# Patient Record
Sex: Female | Born: 1940
Health system: Southern US, Community
[De-identification: ages and names within clinical notes are randomized; demographics above are authoritative.]

## PROBLEM LIST (undated history)

## (undated) DIAGNOSIS — M810 Age-related osteoporosis without current pathological fracture: Secondary | ICD-10-CM

## (undated) DIAGNOSIS — K900 Celiac disease: Secondary | ICD-10-CM

## (undated) DIAGNOSIS — J45909 Unspecified asthma, uncomplicated: Secondary | ICD-10-CM

## (undated) DIAGNOSIS — E079 Disorder of thyroid, unspecified: Secondary | ICD-10-CM

## (undated) HISTORY — PX: SINUS EXPLORATION: SHX5214

## (undated) HISTORY — PX: CHOLECYSTECTOMY: SHX55

## (undated) HISTORY — PX: ABDOMINAL HYSTERECTOMY: SHX81

---

## 2000-06-21 ENCOUNTER — Other Ambulatory Visit: Admission: RE | Admit: 2000-06-21 | Discharge: 2000-06-21 | Payer: Self-pay | Admitting: Cardiology

## 2003-11-12 ENCOUNTER — Ambulatory Visit (HOSPITAL_COMMUNITY): Admission: RE | Admit: 2003-11-12 | Discharge: 2003-11-12 | Payer: Self-pay | Admitting: Gastroenterology

## 2003-11-17 ENCOUNTER — Inpatient Hospital Stay (HOSPITAL_COMMUNITY): Admission: EM | Admit: 2003-11-17 | Discharge: 2003-11-19 | Payer: Self-pay | Admitting: Emergency Medicine

## 2003-11-19 ENCOUNTER — Encounter (INDEPENDENT_AMBULATORY_CARE_PROVIDER_SITE_OTHER): Payer: Self-pay | Admitting: Specialist

## 2003-12-02 ENCOUNTER — Ambulatory Visit (HOSPITAL_COMMUNITY): Admission: RE | Admit: 2003-12-02 | Discharge: 2003-12-02 | Payer: Self-pay | Admitting: Gastroenterology

## 2003-12-02 ENCOUNTER — Encounter (INDEPENDENT_AMBULATORY_CARE_PROVIDER_SITE_OTHER): Payer: Self-pay | Admitting: Specialist

## 2003-12-03 ENCOUNTER — Inpatient Hospital Stay (HOSPITAL_COMMUNITY): Admission: AD | Admit: 2003-12-03 | Discharge: 2003-12-06 | Payer: Self-pay | Admitting: Internal Medicine

## 2003-12-03 ENCOUNTER — Encounter (INDEPENDENT_AMBULATORY_CARE_PROVIDER_SITE_OTHER): Payer: Self-pay | Admitting: Internal Medicine

## 2004-02-27 ENCOUNTER — Ambulatory Visit (HOSPITAL_COMMUNITY): Admission: RE | Admit: 2004-02-27 | Discharge: 2004-02-27 | Payer: Self-pay | Admitting: Gastroenterology

## 2007-12-03 ENCOUNTER — Emergency Department (HOSPITAL_COMMUNITY): Admission: EM | Admit: 2007-12-03 | Discharge: 2007-12-03 | Payer: Self-pay | Admitting: Emergency Medicine

## 2010-04-16 ENCOUNTER — Emergency Department (HOSPITAL_COMMUNITY): Admission: EM | Admit: 2010-04-16 | Discharge: 2010-04-16 | Payer: Self-pay | Admitting: Emergency Medicine

## 2011-03-15 LAB — COMPREHENSIVE METABOLIC PANEL
ALT: 23 U/L (ref 0–35)
AST: 28 U/L (ref 0–37)
Albumin: 4.1 g/dL (ref 3.5–5.2)
Alkaline Phosphatase: 75 U/L (ref 39–117)
BUN: 18 mg/dL (ref 6–23)
CO2: 27 mEq/L (ref 19–32)
Calcium: 8.9 mg/dL (ref 8.4–10.5)
Chloride: 107 mEq/L (ref 96–112)
Creatinine, Ser: 1.23 mg/dL — ABNORMAL HIGH (ref 0.4–1.2)
GFR calc Af Amer: 53 mL/min — ABNORMAL LOW (ref >60)
GFR calc non Af Amer: 44 mL/min — ABNORMAL LOW (ref >60)
Glucose, Bld: 114 mg/dL — ABNORMAL HIGH (ref 70–99)
Potassium: 3.7 mEq/L (ref 3.5–5.1)
Sodium: 141 mEq/L (ref 135–145)
Total Bilirubin: 0.7 mg/dL (ref 0.3–1.2)
Total Protein: 7.4 g/dL (ref 6.0–8.3)

## 2011-03-15 LAB — DIFFERENTIAL
Basophils Absolute: 0.1 10*3/uL (ref 0.0–0.1)
Basophils Relative: 0 % (ref 0–1)
Eosinophils Absolute: 0 10*3/uL (ref 0.0–0.7)
Eosinophils Relative: 0 % (ref 0–5)
Lymphocytes Relative: 10 % — ABNORMAL LOW (ref 12–46)
Lymphs Abs: 1.4 10*3/uL (ref 0.7–4.0)
Monocytes Absolute: 0.9 10*3/uL (ref 0.1–1.0)
Monocytes Relative: 6 % (ref 3–12)
Neutro Abs: 12 10*3/uL — ABNORMAL HIGH (ref 1.7–7.7)
Neutrophils Relative %: 83 % — ABNORMAL HIGH (ref 43–77)

## 2011-03-15 LAB — URINALYSIS, ROUTINE W REFLEX MICROSCOPIC
Bilirubin Urine: NEGATIVE
Glucose, UA: NEGATIVE mg/dL
Hgb urine dipstick: NEGATIVE
Ketones, ur: NEGATIVE mg/dL
Nitrite: NEGATIVE
Protein, ur: NEGATIVE mg/dL
Specific Gravity, Urine: 1.017 (ref 1.005–1.030)
Urobilinogen, UA: 0.2 mg/dL (ref 0.0–1.0)
pH: 6.5 (ref 5.0–8.0)

## 2011-03-15 LAB — CBC
HCT: 46.4 % — ABNORMAL HIGH (ref 36.0–46.0)
Hemoglobin: 14.9 g/dL (ref 12.0–15.0)
MCHC: 32.1 g/dL (ref 30.0–36.0)
MCV: 88 fL (ref 78.0–100.0)
Platelets: 192 10*3/uL (ref 150–400)
RBC: 5.28 MIL/uL — ABNORMAL HIGH (ref 3.87–5.11)
RDW: 15.3 % (ref 11.5–15.5)
WBC: 14.4 10*3/uL — ABNORMAL HIGH (ref 4.0–10.5)

## 2011-03-15 LAB — URINE CULTURE
Colony Count: NO GROWTH
Culture: NO GROWTH

## 2011-03-15 LAB — URINE MICROSCOPIC-ADD ON

## 2011-05-13 NOTE — Consult Note (Signed)
NAME:  Jessica Esparza, Jessica Esparza                       ACCOUNT NO.:  [id number]   MEDICAL RECORD NO.:  [id number]                   PATIENT TYPE:  AMB   LOCATION:  ENDO                                 FACILITY:  [facility]   PHYSICIAN:  Lorenza Burton, M.D.                DATE OF BIRTH:  [birth year]   DATE OF CONSULTATION:  11/12/2003  DATE OF DISCHARGE:  11/12/2003                                   CONSULTATION   REASON FOR CONSULTATION:  Diarrhea times two weeks, nausea and vomiting.   ASSESSMENT:  1. Diarrhea times two weeks, rule out Clostridium difficile colitis,     Giardia, Cryptococcus, collagenous colitis.  2. Nausea and vomiting, etiology unknown; question viral syndrome.  3. Hypertension since 1998.  4. Hypothyroidism since 1998.  5. Gastroesophageal reflux disease on proton pump inhibitor.  6. Hemorrhoids, internal.  7. Hyperlipidemia.  8. Question of chronic renal insufficiency, check BUN and creatine today.  9. Osteoporosis diagnosed in December of 2002.  10.      History of rosacea.  11.      Hysterectomy at age 76.  12.      Status post cholecystectomy in 1992.  13.      Sinus surgery in 1996.  14.      Foot fracture summer of this year.  15.      Decreased potassium today 2.8.   RECOMMENDATIONS:  1. Hydrate, replace potassium.  2. Flexible sigmoidoscopy today.  3. Colonoscopy if flexible sigmoidoscopy is unrevealing.  4 . Stool studies for C. differential, Giardia and Cryptosporidia.  1. Continue Phenergan p.r.n.   HISTORY OF PRESENT ILLNESS:  As mentioned above, Jessica Esparza is a 70-year-  old female who is a patient of Dr. Rodrigo Ran who presents today with a two  week history of diarrhea and nausea and vomiting with most all solid foods  with unclear etiology, here for further evaluation.   ALLERGIES:  IBUPROFEN AND CODEINE.  IBUPROFEN CAUSES CHEST TIGHTNESS.  CODEINE CAUSES NAUSEA.  PREDNISONE CAUSES HOT FLASHES.   MEDICATIONS:  1. Synthroid 137 mcg  p.o. daily.  2. Prilosec OTC once daily.  3. Premarin 0.625 daily.  4. Clarinex 5 mg.  5. Flonase 50 mcg.  6. Fosamax 70 mg once per week.  7. Calcium 500 three times per week.  8. Xanax 0.5 q.h.s. and p.r.n.  9. Zantac 75 daily.   SOCIAL HISTORY:  She is married.  She lives with her husband here in  [place].  No history of alcohol or drug use.  Previous tobacco use for 15  years, quit in 1980.  Has three grown children.   FAMILY HISTORY:  Mother died of lung cancer at age 21, had hypertension.  Father is in his 5s, in a nursing home.  Has coronary artery disease,  pacemaker, and five siblings with no significant medical problems.   REVIEW OF SYSTEMS:  Diarrhea x2 weeks, nausea and  vomiting x2 weeks.  Question URI early October, 2004, given a prescription for Zithromax, now is  improved.   PHYSICAL EXAMINATION:  A 70 year old white female with above medical  problems.  Was treated with Trimox for one week.  She was doing well until  two weeks ago when she developed nausea, vomiting with diarrhea.  She was  seen by Dr. Rodrigo Ran on Monday, November 10, 2003, and was prescribed  metronidazole which she was unable to keep down because of her nausea and  vomiting.  She is averaging six to eight loose bowel movements per day  without any evidence of hematochezia and melena.  She has been using  Kaopectate and therefore has seen some black stools.  She denies any  abdominal pain or fever.  There is no chills or rigors.  Patient denies any  hematemesis or CGE.  She has no cardiorespiratory or GU complaints at  present.  Her weight has been stable.  She denies any NSAID use.  There is  no history of ulcers, jaundice or colitis.   LABORATORY DATA:  White count was 6.4, hemoglobin of 15.5, hematocrit 46.7,  platelets 273.  Sodium 138, potassium 2.8, chloride 110, bicarb. 21, BUN 10,  creatinine of 1.7, glucose of 97.  Giardia and Crypto were negative, C.  differential was negative.   LFTs were all normal.  Calcium was 7.8.   Will follow up with the patient closely to see if any of her symptoms  resolve.     Catalina Pizza, M.D.                           Lorenza Burton, M.D.        ZH/MEDQ  D:  11/17/2003  T:  11/17/2003  Job:  [id number]   cc:   Loraine Leriche A. Waynard Edwards, M.D.  [address]  [place]  [place] [address]  Fax: [phone]

## 2011-05-13 NOTE — Op Note (Signed)
NAME:  Jessica Esparza, Jessica Esparza                       ACCOUNT NO.:  [id number]   MEDICAL RECORD NO.:  [id number]                   PATIENT TYPE:  INP   LOCATION:  5157                                 FACILITY:  [facility]   PHYSICIAN:  Anselmo Rod, M.D.               DATE OF BIRTH:  January 05, 1941   DATE OF PROCEDURE:  11/19/2003  DATE OF DISCHARGE:  11/19/2003                                 OPERATIVE REPORT   PROCEDURE PERFORMED:  Esophagogastroduodenoscopy with biopsies.   ENDOSCOPIST:  Anselmo Rod, M.D.   INSTRUMENT USED:  Olympus video panendoscope.   INDICATION FOR PROCEDURE:  A 70 year old white female with a history of  nausea, vomiting, and diarrhea undergoing EGD to rule out peptic ulcer  disease and to bowel sounds the small bowel to further evaluate her  diarrhea.   PREPROCEDURE PREPARATION:  Informed consent was procured from the patient.  The patient was fasted for eight hours prior to the procedure.   PREPROCEDURE PHYSICAL:  VITAL SIGNS:  The patient had stable vital signs.  NECK:  Supple.  CHEST:  Clear to auscultation.  S1, S2 regular.  ABDOMEN:  Soft with normal bowel sounds.   DESCRIPTION OF PROCEDURE:  The patient was placed in the left lateral  decubitus position and sedated with 100 mg of Demerol and 10 mg of Versed in  slow incremental doses.  Once the patient was adequately sedate and  maintained on low-flow oxygen and continuous cardiac monitoring, the Olympus  video panendoscope was advanced through the mouthpiece, over the tongue,  into the esophagus under direct vision.  The entire esophagus appeared  normal with no evidence of ring, stricture, masses, esophagitis, or  Barrett's mucosa.  On advancing the scope into the stomach, the entire  gastric mucosa appeared healthy and without lesions.  There was some barium  present in the small bowel.  Biopsies were done to evaluate the patient's  diarrhea.  No erosions, ulcerations, masses, polyps, etc., were seen.   The  patient tolerated the procedure well without complications.  Retroflexion in  the high cardia revealed no abnormalities.   IMPRESSION:  Essentially normal EGD except for some barium present in the  small bowel, biopsies done from the small bowel to evaluate the patient's  diarrhea.  No ulcers, erosions, masses, or polyps seen.   RECOMMENDATIONS:  1. Await pathology results.  2. Avoid nonsteroidals.  3. Proceed with a colonoscopy at this time.  Further recommendations will be     made after colonoscopy has been done.                                              Anselmo Rod, M.D.   JNM/MEDQ  D:  11/20/2003  T:  11/20/2003  Job:  [id number]

## 2011-05-13 NOTE — Op Note (Signed)
NAME:  [name], Jessica Esparza                       ACCOUNT NO.:  [id number]   MEDICAL RECORD NO.:  [id number]                   PATIENT TYPE:  AMB   LOCATION:  ENDO                                 FACILITY:  [facility]   PHYSICIAN:  Anselmo Rod, M.D.               DATE OF BIRTH:  1941-07-30   DATE OF PROCEDURE:  12/02/2003  DATE OF DISCHARGE:                                 OPERATIVE REPORT   PROCEDURE PERFORMED:  Colonoscopy with biopsies.   ENDOSCOPIST:  Anselmo Rod, M.D.   INSTRUMENT USED:  Olympus videocolonoscope.   INDICATIONS FOR PROCEDURE:  This is a 70 year old white female with a  history of change in bowel habits, severe diarrhea, and hypokalemia,  undergoing a repeat colonoscopy as a colonoscopy done prior to Thanksgiving  showed large amount of residual barium in the colon, and visualization was  incomplete.   PREPROCEDURE PREPARATION:  Informed consent was secured from the patient.  The patient fasted for eight hours prior to the procedure and prepped with a  bottle of magnesium citrate and one gallon of GoLYTELY the night prior to  the procedure.   PREPROCEDURE PHYSICAL:  VITAL SIGNS:  Stable.  NECK:  Supple.  CHEST:  Clear to auscultation.  HEART:  S1, S2 regular.  ABDOMEN:  Soft, with normal bowel sounds.   DESCRIPTION OF THE PROCEDURE:  The patient was placed in the left lateral  decubitus position and sedated with 80 mg of Demerol and 8 mg of Versed  intravenously.  Once the patient was adequately sedated and maintained on  low-flow oxygen and continuous cardiac monitor, the Olympus videocolonoscope  was advanced from the rectum to the cecum.  There was some residual stool in  the colon.  Multiple washings were done.  The patient's position was changed  from the left lateral to the supine and the right lateral position to  visualize the underlying mucosa and move the stool __________.  The  appendiceal orifice and ileocecal valve were clearly visualized.   In spite  of several efforts, the terminal ileum could not be intubated.  There was  some loss of vascular markings in the cecum and right colon.  Random  biopsies were done to rule out collagenous versus microscopic colitis.  A  small sessile polyp was biopsied at 30 cm.  Retroflexion of the rectum  revealed small internal hemorrhoids.  Small external hemorrhoids were seen  on anal inspection.  The patient tolerated the procedure well without  complications.   IMPRESSION:  1. Small nonbleeding internal and external hemorrhoids.  2. Small sessile polyp biopsied at 30 cm.  3. Loss of vascular markings of the right colon and cecum.  Biopsies done,     results pending.   RECOMMENDATIONS:  1. Avoid nonsteroidals.  2. Await pathology results.  3. Continue potassium supplementation.  The patient is going to receive four     runs of potassium today.  Her BMET     revealed a potassium of 2.5.  Outpatient follow-up in the next three to     four weeks.  4. Follow up with Dr. Waynard Edwards in for further recommendations with regards to     electrolyte problems and diarrhea in the near future.                                               Anselmo Rod, M.D.    JNM/MEDQ  D:  12/02/2003  T:  12/03/2003  Job:  [id number]   cc:   Loraine Leriche A. Waynard Edwards, M.D.  [address]  [place]  [place] [address]  Fax: [phone]

## 2011-05-13 NOTE — H&P (Signed)
NAME:  Jessica Esparza, Jessica Esparza                       ACCOUNT NO.:  [id number]   MEDICAL RECORD NO.:  [id number]                   PATIENT TYPE:  INP   LOCATION:  1845                                 FACILITY:  [facility]   PHYSICIAN:  Anselmo Rod, M.D.               DATE OF BIRTH:  [birth year]   DATE OF ADMISSION:  11/17/2003  DATE OF DISCHARGE:                                HISTORY & PHYSICAL   REASON FOR ADMISSION:  Continued nausea and vomiting, dehydration, and  hypokalemia.   HISTORY OF PRESENT ILLNESS:  Jessica Esparza is a 70 year old white female with  a past medical history significant for list below who was treated  approximately one month ago with a Zithromax Tri Pack for suspected upper  respiratory infection.  She was also started on Advair and albuterol at that  time secondary to wheezing associated with ibuprofen use for pain.  Shortly  after starting the Zithromax, the patient had an episode of diarrhea and  this has continued off and on with frequent urgency with her diarrhea watery  in nature, no significant color, no melena or hematochezia.  She continued  to have the diarrhea, and then approximately two weeks ago developed nausea  and vomiting with diarrhea as well.  She had recently seen Dr. Rodrigo Ran  on November 10, 2003, and was prescribed metronidazole.  This was stopped  when the patient was seen on November 12, 2003, by Dr. Charna Elizabeth for a  flexible sigmoidoscopy which revealed a normal colon.  She reports having  nausea and vomiting over this past weekend with half a banana and some  yogurt.  She has not had any significant solid food in over 1-1/2 weeks.  She states she has been drinking lots of fluid, but has had six to eight  loose bowel movements per day, some which wake her up at night.  Has tried  Kaopectate, but was stopped and continued taking Lomotil.  She denies any  abdominal pain, no fever, chills, rigors, no hematemesis, no significant  reflux, no  problems with chest pain or respiratory complaints, no GU  complaints.  Denies any weight loss or weight gain.  Denies any history of  ulcers, jaundice, or colitis.   PAST MEDICAL HISTORY:  1. Hypertension since 1998.  2. Hypothyroidism since 1998.  3. GERD, on PPI.  4. Hemorrhoids, internal.  5. Hyperlipidemia.  6. Question of chronic renal insufficiency.  7. Osteoporosis.  8. History of rosacea.  9. Hysterectomy at age 26.  1.      Status post cholecystectomy in 1992.  11.      Sinus surgery in 1996.  12.      Foot fracture in the summer of 2004.  13.      Hypercalcemia, question of sarcoidosis, no biopsies have been done,     no source of sarcoid localized.   MEDICATIONS ON ADMISSION:  1. Synthroid 0.137 mg  q.d.  2. Clarinex.  3. Premarin.  4. Flonase.  5. Fosamax.  6. Calcium.  7. Lomotil just in the last two weeks.  8. Occasional Kaopectate.  She has been taking any Fosamax or calcium.   ALLERGIES:  1. IBUPROFEN caused an episode of wheezing and chest tightness.  2. CODEINE causes nausea.   SOCIAL HISTORY:  She is married and lives with her husband in [place].  No history of alcohol or drug use.  Did have a smoking history back in 1980,  smoked for approximately 15 years.  Has three grown children and several  grandchildren.   FAMILY HISTORY:  Mother died of lung cancer at 90, had hypertension.  Her  father is alive in his 34s at a nursing home, has coronary artery disease  and pacemaker.  Has five siblings who are alive and well.  No health  problems with her children as well.   PHYSICAL EXAMINATION:  VITAL SIGNS:  Temperature 97.3, blood pressure  121/75, respiratory rate 18, 98% on room air.  Pulse 102.  GENERAL:  A mildly obese white female in no acute distress.  Alert and  oriented x3.  HEENT:  Pupils equal, round, reactive to light and accommodation.  Funduscopic exam revealed no acute abnormalities.  Oropharynx was clear, no  lesions noted.  No  lymphadenopathy.  Mucous membranes are moist.  NECK:  Supple, no thyromegaly.  RESPIRATORY:  Lungs are clear to auscultation bilaterally, no wheezes,  rhonchi, or rales.  CARDIOVASCULAR:  Regular rate and rhythm, no murmurs, rubs, or gallops.  Pulses are 2+ in all extremities.  No carotid bruits.  ABDOMEN:  Soft, mildly distended, but no mass appreciated, no pain to  palpation.  Positive bowel sounds throughout.  No hepatosplenomegaly.  EXTREMITIES:  No lower extremity cyanosis, clubbing, or edema.  SKIN:  Normal.  No lesions appreciated.  NEUROLOGIC:  Cranial nerves II-XII intact.  No significant deficits.   LABORATORY DATA:  WBC of 7.9, hemoglobin 17.5, platelets 305.  Sodium 137,  potassium 2.6, chloride 108, bicarbonate 20, BUN 21, creatinine 1.8, glucose  103, calcium 8.3, total protein 6.6, albumin 3.4, AST 41, ALT 32, alkaline  phosphatase 57, total bilirubin of 0.7.   CT scan of the abdomen with Gastrografin showed some mild enlarged  mesenteric lymph nodes, no significant mass or obstruction seen, question of  a mild filling defect in the jejunum.  All other evaluation of the CT scan  was normal.   IMPRESSION:  This is a 70 year old white female with a two week history of  diarrhea, nausea and vomiting with decreased p.o. intake.  The patient has  no significant pain associated with it.  It is unclear exactly what may be  going on.   ASSESSMENT AND PLAN:  1. Nausea and vomiting/diarrhea.  The patient had a CT scan which revealed a     slightly enlarged mesenteric lymph nodes and a question of a filling     defect in the jejunum.  Unclear if this was related to the nausea and     vomiting or diarrhea.  There is no significant obstruction related to     this.  Contrast passed through without any difficulty.  We will continue     with Phenergan p.r.n. for nausea and vomiting.  We will set the patient    up for a small-bowel follow-through with barium tomorrow for further      evaluation of this filling defect in her jejunum.  Unclear exactly how  this is causing her nausea and vomiting.  2. Chronic renal insufficiency/dehydration.  The patient had elevated     creatinine of 1.8 today up from 1.7 six days ago.  I mentioned that     chronic renal insufficiency could be a possibility previously, unclear of     the patient's baseline.  We will continue to monitor and heavily fluid     hydrate.  3. Hypokalemia.  Secondary to vomiting no and diarrhea.  We will continue to     replete and check basic metabolic panels periodically.  4. Hypothyroidism.  Continue with the Synthroid.  We will check a TSH and T4     since it has been almost six months since it was previously checked per     our records.   PLAN:  1. Heavily fluid hydrate and replace potassium.  2. Schedule for a small-bowel follow-through with barium in the a.m.  3. Once results are back on that, if no significant problems, we will set     her up for a colonoscopy and possible esophagogastroduodenoscopy on     Wednesday.  4. Given her reflux disease, we will start her on Protonix.  5. Diarrhea.  We will recheck a Clostridium difficile and a guaiac of her     stools.  6. Question history of sarcoid.  We will check ACE level at this time.      Catalina Pizza, M.D.                           Anselmo Rod, M.D.    ZH/MEDQ  D:  11/17/2003  T:  11/17/2003  Job:  [id number]   cc:   Loraine Leriche A. Waynard Edwards, M.D.  [address]  [place]  [place] [address]  Fax: [phone]

## 2011-05-13 NOTE — Discharge Summary (Signed)
NAME:  JOSSELINE, REDDIN                       ACCOUNT NO.:  [id number]   MEDICAL RECORD NO.:  [id number]                   PATIENT TYPE:  INP   LOCATION:  4740                                 FACILITY:  [facility]   PHYSICIAN:  Mark A. Perini, M.D.                DATE OF BIRTH:  1941-01-24   DATE OF ADMISSION:  12/03/2003  DATE OF DISCHARGE:  12/06/2003                                 DISCHARGE SUMMARY   DISCHARGE DIAGNOSES:  1. Persistent diarrhea for over one month, felt to be resistant celiac sprue     at this point in time.  2. Significant malabsorption resulting in severe hypokalemia, also resulting     in hypothyroidism due to malabsorption of her thyroid medication, and     resulting in protein depletion with low albumin levels.  3. History of sarcoidosis remotely.  4. Bilateral left greater than right painful ankle swelling, status post     arthrocentesis this admission, results pending, felt to be probable     inflammatory process.  5. Hypothyroidism.  6. Hypertension, not requiring treatment at this time.  7. Chronic renal insufficiency since episode of sarcoidosis in 1998.  8. Seasonal and perineal allergic rhinitis.  9. Rosacea.  10.      Gastroesophageal reflux disease.  11.      Hyperlipidemia.  12.      Osteoporosis.   PROCEDURE:  Ms. Nobis had IV hydration with replacement of electrolytes  and potassium.  She did undergo a left ankle aspiration revealing 10 ml of  clear and cloudy synovial fluid.  She also underwent a 0.5 ml injection of  Depo-Medrol into the left ankle joint at the end of her procedure.   DISCHARGE MEDICATIONS:  1. Synthroid 175 mcg p.o. daily.  2. Premarin 0.625 mg three times per week.  3. Flonase two sprays each nostril daily.  4. Xanax 0.5 mg 1/2 to 1 tablet daily as needed.  5. Clarinex 5 mg once daily as needed.  6. Zantac 75 once daily.  7. She is to hold off on Prilosec for now as it could worsen diarrhea.  8. Vancomycin 125 mg p.o.  q.i.d. x7 days empirically for possible     undiagnosed C. difficile colitis.   HISTORY OF PRESENT ILLNESS:  Please see full dictated history and physical  for details.  However, Ms. Schmaltz is a pleasant 70 year old female who has  had over one month of persistent diarrhea.  She has had extensive workup,  including two colonoscopies, upper endoscopy, small-bowel follow-through  study, and CT scan of the abdomen and pelvis.  She did have one celiac sprue  related antibody which returned positive in her workup.  She had only  recently been placed on a gluten-free diet.  However, despite this, she has  continued to have diarrhea and severe hypokalemia with potassium levels in  the range of 2.5 to 2.7.  Therefore, she is admitted  electively to replace  her electrolytes in an attempt to get her diarrhea under further control.   HOSPITAL COURSE:  Ms. Poteet was admitted to a telemetry bed.  She was  hydrated with 1/2 normal saline with 40 mEq of potassium cholesterol per  liter at 75 cc per hour.  She tolerated this well.  Her potassium levels did  gradually improve to the normal range.  She was placed on low dose Lomotil  and placed on a gluten-free diet.  Within 24 hours, her diarrhea seemed to  improve quite a bit.  She did have significant ankle swelling that had just  come up in the last few days, therefore, orthopaedics was consulted and  assisted with arthrocentesis.  She was noted to be significantly hypothyroid  with a TSH up to 65, despite the fact that she had been taking her thyroid  medication daily.  She was given a few doses of IV Synthroid, but then was  subsequently discharged on a slightly increased dose of Synthroid.  By  December 06, 2003, her diarrhea had improved, her potassium was up in the  normal range, and it was felt she was stable for discharge home with  outpatient followup to follow.   DISCHARGE PHYSICAL EXAMINATION:  VITAL SIGNS:  Afebrile.  Temperature  96.8,  pulse 75, respiratory rate 20, blood pressure 122/78, 98% saturation on room  air.  GENERAL:  She was in no acute distress.  CHEST:  Clear to auscultation bilaterally.  HEART:  Regular rate and rhythm with a normal S1 and S2.  ABDOMEN:  Benign.  EXTREMITIES:  She did have trace ankle edema, left greater than right.   DISCHARGE LABORATORY DATA:  White count 5.9, hemoglobin 13, platelet count  241.  Sodium 139, potassium 4.8, chloride 116, CO2 21, BUN 9, creatinine  1.1, glucose 83.  Albumin 2.7, total protein 5.6.  TSH was 63.  Of note, she  also had an EKG which was essentially normal.  She had a slight nonspecific  T-wave abnormality, but no significant abnormalities were noted.  She had  multiple stool studies sent this admission which are mostly pending at the  time of discharge.  Furthermore, the full results of her left ankle  arthrocentesis synovial fluid analysis are pending at the time of this  dictation, and these will need to be followed up.  She did have a CD4 T-  helper cell count which was 770, which was within the normal range.  The  percent T-helper cells was slightly elevated at 57%.  Peripheral smear was  reviewed which showed a few reactive lymphocytes, a few large platelets, a  few acanthocytes and elliptocytes, and rare basophilic stippling.  Giardia  and Cryptosporidium screen in the stool was pending.  Microsporidians stain  was pending.  Dhec toxin in the stool was pending.  AFB smear and culture of  the stool was pending.  Routine stool culture was pending.  Ova and  parasites study was pending as well.  Free T4 was 0.9, which is at the low  end of normal.  Free T3 was low at 2.1.  Random a.m. Cortisol level was  normal at 9.1 on December 04, 2003.   DISCHARGE INSTRUCTIONS:  1. Ms. Nephew is to continue her gluten-free diet.  2. She is to use Imodium if her diarrhea recurs.  3. She is to follow up with Dr. Waynard Edwards in three to four days. 4. She is to  follow up at [facility]  with Dr. Berna Spare on December 11, 2003, for an outpatient visit.                                                Mark A. Waynard Edwards, M.D.    MAP/MEDQ  D:  12/08/2003  T:  12/09/2003  Job:  [id number]   cc:   Lina Sar, M.D. [facility]   Jyothi Elsie Amis, M.D.  Fax: [phone]   Kerrin Champagne, M.D.  [address]  [place]  [place] [address]  Fax: [phone]   Dr. Berna Spare  Gastroenterologist at [facility]

## 2011-05-13 NOTE — Op Note (Signed)
NAME:  Jessica Esparza, Jessica Esparza                       ACCOUNT NO.:  [id number]   MEDICAL RECORD NO.:  [id number]                   PATIENT TYPE:  INP   LOCATION:  5157                                 FACILITY:  [facility]   PHYSICIAN:  Anselmo Rod, M.D.               DATE OF BIRTH:  [birth year]   DATE OF PROCEDURE:  11/19/2003  DATE OF DISCHARGE:  11/19/2003                                 OPERATIVE REPORT   PROCEDURE PERFORMED:  Colonoscopy with random biopsies.   ENDOSCOPIST:  Anselmo Rod, M.D.   INSTRUMENT USED:  Olympus video colonoscope.   INDICATION FOR PROCEDURE:  A 70 year old white female with diarrhea, nausea  and vomiting, and unrevealing CT scan of the abdomen and pelvis except for  some adenopathy, and a normal EGD.  Rule out colonic polyps, masses, etc.  Random colon biopsies have been planned to rule out microscopic versus  collagenous colitis.   PREPROCEDURE PHYSICAL:  Informed consent was procured from the patient.  The  patient was fasted for eight hours prior to the procedure and prepped with a  gallon of GoLYTELY the night prior to the procedure.   PREPROCEDURE PHYSICAL:  VITAL SIGNS:  The patient had stable vital signs.  NECK:  Supple.  CHEST:  Clear to auscultation.  S1, S2 regular.  ABDOMEN:  Soft with normal bowel sounds.   DESCRIPTION OF PROCEDURE:  The patient was placed in the left lateral  decubitus position and sedated with Demerol and Versed for the EGD.  No  additional sedation was used for the colonoscopy.  Once the patient was  adequately sedate and maintained on low-flow oxygen and continuous cardiac  monitoring, the Olympus video colonoscope was advanced from the rectum to  the cecum with difficulty.  There was a large amount of residual barium in  the colon.  Multiple washes were done.  No large masses or polyps were seen.  The appendiceal orifice and the ileocecal valve were clearly visualized and  photographed.  Very small lesions could be  missed.  Random colon biopsies  were done to rule out microscopic versus collagenous colitis.  Retroflexion  in the rectum revealed small internal hemorrhoids.   IMPRESSION:  1. No large masses or polyps seen.  2. No evidence of diverticulosis.  3. Random colon biopsies done to rule out microscopic versus collagenous     colitis.  4. Some residual barium in the colon, especially on the right, small lesions     could have been missed.  5. Small internal hemorrhoids seen on retroflexion in the rectum.   RECOMMENDATIONS:  1. The patient is going to be discharged home today with plans to follow up     in the office within the next week.  2. Await pathology results.  Treat with steroids if biopsies show     collagenous colitis.  3. Potassium supplements 40 mEq daily for the next seven days.  4.  Lomotil p.r.n.  5. Further recommendations made in follow-up.                                               Anselmo Rod, M.D.    JNM/MEDQ  D:  11/20/2003  T:  11/20/2003  Job:  [id number]   cc:   Loraine Leriche A. Waynard Edwards, M.D.  [address]  [place]  [place] [address]  Fax: [phone]

## 2011-05-13 NOTE — H&P (Signed)
NAME:  Jessica Esparza                       ACCOUNT NO.:  [id number]   MEDICAL RECORD NO.:  [id number]                   PATIENT TYPE:  INP   LOCATION:  4740                                 FACILITY:  [facility]   PHYSICIAN:  Mark A. Perini, M.D.                DATE OF BIRTH:  11-20-1941   DATE OF ADMISSION:  12/03/2003  DATE OF DISCHARGE:                                HISTORY & PHYSICAL   CHIEF COMPLAINT:  Diarrhea and low potassium.   HISTORY OF PRESENT ILLNESS:  Jessica Esparza is a pleasant 70 year old female.  She has a past history significant for hypothyroidism and hypertension.  In  1998 she had an illness with an unclear diagnosis that involved  hypercalcemia.  It was felt that she probably had sarcoidosis and there was  possibly some renal insufficiency related to this as well.  She was treated  with steroids for some time and then there has been no known recurrence of  any hypercalcemia or sarcoidosis type symptoms.  She also has a history of  allergic rhinitis, gastroesophageal reflux disease, hyperlipidemia, and  osteoporosis.   Jessica Esparza recent history began approximately October 06, 2003.  She was  seen in our office with a cough for 10 days and clear nasal drainage, some  wheezing and shortness of breath.  In the office she was felt to be having  an episode of bronchospasm or reactive airway disease, or some sort of  asthmatic bronchitis.  At that time her peak flows were about 300 and she  had significantly decreased breath sounds on lung exam.  There is some  question whether she might have aspirin triad as she has had trouble with  chest tightness and wheezing in the past with ibuprofen, and she had been  taking aspirin.  She was advised to discontinue aspirin.  She was treated  with azithromycin and a prednisone taper, albuterol and Advair 250/50  inhaler.  The patient then developed significant flushing, which was felt to  be due to the prednisone.  After her  treatment for this the patient then  developed diarrhea and nausea.  She was seen in the office on November 10, 2003 with diarrhea, nausea and some midabdominal discomfort.  It was felt  that she might have C-diff colitis given that she had just been treated with  a broad spectrum antibiotic.  Her asthma had cleared up promptly and she had  stopped the Advair after about three week.  She did have six to eight watery  stools per day for several days prior to her November 15th admission.  He  was given Flagyl, but then began vomiting this up and therefore this was  discontinued after one day.  She could not take Kaopectate either due to  vomiting.  She was given some Phenergan suppositories.  At this point she  was going to be started on vancomycin; however, because of her  symptoms she  contacted [facility], who is also her neighbor.  Jessica Esparza has since done  an extensive workup for her diarrhea.  This included admission to the  hospital on November 16, 2003.  She had a CT scan of the abdomen and pelvis,  which showed some enlarged mesenteric lymph nodes with some possible  intraluminal soft tissue abnormality in the jejunum as well as the pelvic  portion, which was fairly unremarkable.  She had further workup including  EGD and colonoscopy, which did not reveal a cause of her diarrhea.  She had  a small bowel follow through, the results of which I do not have at this  time, but it has been reviewed by Jessica Esparza without clear evidence for the  cause of her diarrhea.   Jessica Esparza continued to have trouble with diarrhea and poor absorbance of  nutrition with low potassium level.  She required potassium supplementation  several times.  She went back for a repeat colonoscopy on December 02, 2003,  which just showed some small nonbleeding internal and external hemorrhoids,  a small sessile polyp at 30 cm, which was biopsied, and some increased  vascular markings in the right colon and cecum  with biopsies done; however,  biopsies showed no significant inflammation and the polyp at 30 cm was  simply a hyperplastic polyp.   Given Jessica Esparza's continued hypokalemia with potassiums ranging in the  mid 2s she is admitted for potassium supplementation and to further address  her diarrheal illness.   PAST MEDICAL AND SURGICAL HISTORY:  1. G3, P3, parity status with all normal vaginal deliveries.  2. Hysterectomy at age 54 for bleeding with no oophorectomy.  3. History of cholecystectomy in 1992.  4. Sinus surgery in 1996.  5. Hypothyroidism since 1998.  6. Hypertension since 1998.  7. A somewhat unclear illness diagnosed at [facility] in 1998 though to be     sarcoidosis, which involved hypercalcemia.  8. Chronic renal insufficiency felt to be due to the above-episode of     sarcoidosis.  9. History of seasonal allergic rhinitis and perennial allergic rhinitis.  10.      Rosacea.  11.      Hemorrhoids.  12.      Mild gastroesophageal reflux disease.  13.      Hyperlipidemia.  14.      Osteoporosis diagnosed in December 2002.  15.      Elevated C-reactive protein noted in February 2003.  16.      Left foot fracture in the Summer of 2004.   ALLERGIES:  FLAGYL causes vomiting.  She avoids dairy products to some  degree and calcium supplements because of her past history of hyperkalemia,  but we have not felt that this is a definite problem.  She had chest  tightness and wheezing to IBUPROFEN in the past.  CODEINE causes nausea and  she had significant Flushing and a bright red face reaction to PREDNISONE.   CURRENT MEDICATIONS:  1. Levothroid 137 mcg daily.  2. Premarin 0.625 mg that she takes three to four days a week.  3. Zantac 75 daily.  4. Flonase 2 sprays to each nostril daily.  5. The patient has been off her Lotensin and hydrochlorothiazide for some     time. 6. Alprazolam 0.5 mg 1/2-1 tablet each evening at needed.  7. MetroLotion to her face daily.  8.  Fosamax 70 mg once a week, but this has been on hold  for three or four     weeks.  9. Clarinex 5 mg daily.  10.      Aspirin has been on hold since her asthma reaction in October.  11.      Calcium with D supplement that she was taking three times weeks has     been on hold.  12.      Prilosec 20 mg once daily had been continued at home.   SOCIAL HISTORY:  The patient has been married since 47.  She has three  children; two sons and a daughter.  She has four grandchildren.  She has a  high school education.  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  She had a 25 pack/year smoking history, but quit in 1980.  No  alcohol.  No drug use history.   FAMILY HISTORY:  Father is in his 79s.  Mother died at age 57 of lung cancer  and mild hypertension.  She has an older sister.   PHYSICAL EXAMINATION:  VITAL SIGNS:  Temperature 97.6, blood pressure  105/64, pulse 69, respiratory rate 20, and 96% saturation on room air.  GENERAL APPEARANCE:  The patient is in no acute distress.  LUNGS:  Lungs are clear to auscultation bilaterally with no wheezes, rales  or rhonchi.  HEART:  Heart is regular rate and rhythm with no murmur, rub or gallop.  ABDOMEN:  Abdomen is soft and nontender.  EXTREMITIES:  Both ankles are tender and warm with some mild swelling.  No  erythema.   LABORATORY DATA:  EKG is essentially normal.  Laboratory data shows a  potassium of 2.8, which is now up to 3.1, sodium 143, chloride 117, CO2 23,  BUN 12, creatinine 1.5, glucose 87, and calcium 7.9.  Urinalysis is  negative.  LFTs are normal with the exception of an albumin of 3.1.  Phosphorus is 2.3, magnesium 1.8.  White count 7.0, hemoglobin 13.8 and  platelet count 254,000; and, there are 70% seg, 18% lymphocytes, and 10%  monocytes.  C-diff in the stool was negative times two on November 18, 2003.  TSH is currently 10 with a total T3 2.1 pcg/ml, which is low and a free T4  of 0.9 ng/dcl, which is low.   White count is 6.4, hemoglobin 14.2 and  platelet count 252,000 on repeat.  Stool culture is pending.  Random  cortisol level done this morning is 9.1 mcg/dcl, which is appropriate.  CD-4  count is 770, which is normal with  percent helper count of 57%m which is  slightly elevated.  Phosphorus on December 03, 2003 was 2.3, which is  normal, and magnesium 1.8, which is normal.   Other labs from Dr. Kenna Gilbert workup include a reticulin antibody level, which  is negative.  The patient had some sprue studies done including schisto-  transglutaminase, gliadin antibodies, which are actually not included in the  report that I have right now.  Apparently one of her antibodies was  reportedly positive per Jessica Esparza.  To review small bowel follo9w through  done on November 18, 2003 shows no mucosal abnormality in the jejunum or elsewhere in the small bowel.  Small transient time was normal.  Jessica Esparza  has reviewed the pathology slides with the pathologist; and, it is not felt  that there has been evidence of Whipple's disease.   ASSESSMENT AND PLAN:  [age]-year-old female with unexplained persistent  diarrhea with significant malabsorption including severe hypokalemia and  inability to absorb her thyroid medication.  Also there is some evidence of  protein malnutrition with an albumin of 3.1.   We will admit and hydrate with fluids including potassium supplementation.  The patient is having other workup at this time in the hospital including  stool studies for ova and parasites, Giardia, Cryptosporidium,  Microsporidia, mycobacteria, and a TB stool culture as well as stool studies  for E. coli, salmonella and shigella.  She is going to have a peripheral  smear for T whippelii.  Furthermore, we will give her some IV Synthroid and  continue her oral thyroid supplements.  We will give her Lomotil 1 tablet  twice a day a she has had nine stools today.  We will try to get some oral  potassium going  with low dose micro K 8 mEq four times days.  She has been  intolerant of this  in the past.  Furthermore, for her ankle swelling it is  feared that she may be developing a gout flare.  She does not have a history  of gout.  We have asked orthopedics to possibly do a arthrocentesis to see  if we can confirm the ongoing process in her ankles.  If the patient does  not show improvement in her diarrhea or does not show the ability to absorb  potassium supplements she may require tertiary care evaluation.  She  currently does have an appointment with Dr. Laurena Slimmer at [facility] for some  time next week.                                                Mark A. Waynard Edwards, M.D.    MAP/MEDQ  D:  12/04/2003  T:  12/04/2003  Job:  [id number]   cc:   Anselmo Rod, M.D.  Fax: [phone]   Lina Sar, M.D. [facility]

## 2011-05-13 NOTE — Consult Note (Signed)
NAME:  Jessica Esparza, Jessica Esparza                       ACCOUNT NO.:  [id number]   MEDICAL RECORD NO.:  [id number]                   PATIENT TYPE:  INP   LOCATION:  4740                                 FACILITY:  [facility]   PHYSICIAN:  Kerrin Champagne, M.D.                DATE OF BIRTH:  March 14, 1941   DATE OF CONSULTATION:  12/05/2003  DATE OF DISCHARGE:                                   CONSULTATION   REQUESTING PHYSICIAN:  Mark A. Perini, M.D.   ORTHOPEDIC DIAGNOSIS:  1. Left ankle effusion with left foot and ankle swelling, rule out     cellulitis, rule out gout, possibly secondary to primary sarcoid.  2. Chronic diarrhea, unknown origin.   HISTORY OF PRESENT ILLNESS:  The patient is a 70 year old female who reports  a two-day history of increasing left ankle swelling and pain with  ambulation.  She describes no history of trauma, relates that this past  summer she did have a stress fracture of the left great toe.  Apparently has  been told that she had osteoporosis changes of her upper back and throughout  her hip.  She has been admitted because of a six-week history of diarrhea,  hypothyroidism, difficulties maintaining her electrolyte status.  She  reports that she has no pain with nonweightbearing status.  The pain is  present with weightbearing of the left ankle greater than right.  She does  describe a past history of sarcoidosis diagnosed at the [facility].   PHYSICAL EXAMINATION:  Trace edema of the left leg from the mid calf  downwards.  Negative Hoffman's sign.  Pitting edema of the left dorsal foot,  left ankle, left anterior calf, trace to 1+.  Minimal effusion of the left  ankle.  Swelling of the left dorsal foot present.  This patient is nontender  over most bony prominences with the exception of the left anterolateral  ankle joint in the region of the anterior left ankle ligaments.  Here the  use of mild flexion was evidence of some suspicion  for left ankle joint  effusion.  Dorsalis pedis pulse, posterior tibialis pulse normal.  Range of  motion of the talotibial joint appears pain-free.  Range of motion of the  left subtalar joint pain-free.  Mid foot, forefoot of the left foot pain-  free.  Tenderness left anterolateral ankle joint along the anterolateral  ankle ligaments.   LABORATORY DATA:  No x-rays available for review.   PROCEDURE:  Today I aspirated the left ankle atraumatically.  Ten mL of  clear, cloudy synovial fluid found.  This was sent for stat cell count and  differential, stat glucose and protein, stat crystal analysis and gram  stain, anaerobic and aerobic cultures and sensitivities.   IMPRESSION:  Atraumatic effusion, left ankle, infectious versus inflammatory  process.  In light of history of sarcoid, this may relate to primary  sarcoidosis.  Rule out infection, rule out gouty flare.   PLAN:  Follow up the patient's laboratory data.  Today I did instill 0.5 mL  of Depo-Medrol into the left ankle joint at the end of the procedure.  Follow up her status while hospitalized.                                               Kerrin Champagne, M.D.   Myra Rude  D:  12/05/2003  T:  12/06/2003  Job:  [id number]

## 2011-05-13 NOTE — Op Note (Signed)
NAME:  Jessica Esparza, Jessica Esparza                       ACCOUNT NO.:  [id number]   MEDICAL RECORD NO.:  [id number]                   PATIENT TYPE:  AMB   LOCATION:  ENDO                                 FACILITY:  [facility]   PHYSICIAN:  Anselmo Rod, M.D.               DATE OF BIRTH:  02/18/1941   DATE OF PROCEDURE:  11/12/2003  DATE OF DISCHARGE:                                 OPERATIVE REPORT   PROCEDURE:  Flexible sigmoidoscopy up to 90 cm.   ENDOSCOPIST:  Charna Elizabeth, M.D.   INSTRUMENT USED:  Olympus video colonoscope.   INDICATIONS FOR PROCEDURE:  History of diarrhea for the last one week with  several loose bowel movements, rule out C. difficile colitis, etc.   PREPROCEDURE PREPARATION:  Informed consent was obtained from the patient.  The patient was fasted for four hours prior to the procedure and was advised  to come to the hospital for a flexible sigmoidoscopy.  The patient had  received a course of Zithromax in early October but had not received any  other antibiotics according to her office records obtained from Dr. Waynard Edwards.   PREPROCEDURE PHYSICAL:  Patient with stable vital signs.  Neck supple.  Chest clear to auscultation.  S1 and S2 regular.  Abdomen soft with normal  bowel sounds.   DESCRIPTION OF PROCEDURE:  The patient was placed in the left lateral  decubitus position, sedated with 70 mg of Demerol and 7 mg Versed  intravenously.  Once the patient was adequately sedated, maintained on low  flow oxygen, continuous cardiac monitoring, the Olympus video colonoscope  was advanced into the rectum to the mid transverse colon with difficulty.  There was a large amount of residual pasty stool in the colon. There was no  evidence of pseudomembrane or colitis after multiple washes were done.  Two  were collected for C. difficile, Cryptosporidium, and Giardia.  The patient  tolerated the procedure well without complications.  Retroflexion in the  rectum revealed small internal  hemorrhoids.   IMPRESSION:  1. A large amount of dark stool in the colon, no evidence of pseudomembrane.  2. Stool collected for C. difficile toxin as well as Cryptosporidium and     Giardia.  3. Healthy appearing underlying mucosa.  4. Small internal hemorrhoids seen on retroflexion.   RECOMMENDATIONS:  1. Discontinue Flagyl.  2. Try Lomotil 2 pills every 6-8 hours for symptomatic relief.  3. Liberal fluid intake and potassium supplementation.  4. Outpatient follow up next week, earlier if need be.  5. A complete colonoscopy at a later date once the patient's acute symptoms     subside.                                               Anselmo Rod, M.D.  JNM/MEDQ  D:  11/12/2003  T:  11/13/2003  Job:  [id number]   cc:   Loraine Leriche A. Waynard Edwards, M.D.  [address]  [place]  [place] [address]  Fax: [phone]

## 2012-02-13 DIAGNOSIS — M109 Gout, unspecified: Secondary | ICD-10-CM | POA: Diagnosis not present

## 2012-02-13 DIAGNOSIS — R82998 Other abnormal findings in urine: Secondary | ICD-10-CM | POA: Diagnosis not present

## 2012-02-13 DIAGNOSIS — M810 Age-related osteoporosis without current pathological fracture: Secondary | ICD-10-CM | POA: Diagnosis not present

## 2012-02-13 DIAGNOSIS — E785 Hyperlipidemia, unspecified: Secondary | ICD-10-CM | POA: Diagnosis not present

## 2012-02-13 DIAGNOSIS — E039 Hypothyroidism, unspecified: Secondary | ICD-10-CM | POA: Diagnosis not present

## 2012-02-20 DIAGNOSIS — E039 Hypothyroidism, unspecified: Secondary | ICD-10-CM | POA: Diagnosis not present

## 2012-02-20 DIAGNOSIS — E785 Hyperlipidemia, unspecified: Secondary | ICD-10-CM | POA: Diagnosis not present

## 2012-02-20 DIAGNOSIS — K5730 Diverticulosis of large intestine without perforation or abscess without bleeding: Secondary | ICD-10-CM | POA: Diagnosis not present

## 2012-02-20 DIAGNOSIS — Z0000 Encounter for general adult medical examination without abnormal findings: Secondary | ICD-10-CM | POA: Diagnosis not present

## 2012-02-21 DIAGNOSIS — Z1212 Encounter for screening for malignant neoplasm of rectum: Secondary | ICD-10-CM | POA: Diagnosis not present

## 2012-04-23 DIAGNOSIS — Z09 Encounter for follow-up examination after completed treatment for conditions other than malignant neoplasm: Secondary | ICD-10-CM | POA: Diagnosis not present

## 2012-04-23 DIAGNOSIS — R928 Other abnormal and inconclusive findings on diagnostic imaging of breast: Secondary | ICD-10-CM | POA: Diagnosis not present

## 2012-05-22 DIAGNOSIS — L259 Unspecified contact dermatitis, unspecified cause: Secondary | ICD-10-CM | POA: Diagnosis not present

## 2012-06-26 DIAGNOSIS — L819 Disorder of pigmentation, unspecified: Secondary | ICD-10-CM | POA: Diagnosis not present

## 2012-06-26 DIAGNOSIS — L723 Sebaceous cyst: Secondary | ICD-10-CM | POA: Diagnosis not present

## 2012-06-26 DIAGNOSIS — D692 Other nonthrombocytopenic purpura: Secondary | ICD-10-CM | POA: Diagnosis not present

## 2012-06-26 DIAGNOSIS — L821 Other seborrheic keratosis: Secondary | ICD-10-CM | POA: Diagnosis not present

## 2012-07-23 DIAGNOSIS — Z01419 Encounter for gynecological examination (general) (routine) without abnormal findings: Secondary | ICD-10-CM | POA: Diagnosis not present

## 2012-07-23 DIAGNOSIS — N8111 Cystocele, midline: Secondary | ICD-10-CM | POA: Diagnosis not present

## 2012-07-23 DIAGNOSIS — N952 Postmenopausal atrophic vaginitis: Secondary | ICD-10-CM | POA: Diagnosis not present

## 2012-07-23 DIAGNOSIS — Z124 Encounter for screening for malignant neoplasm of cervix: Secondary | ICD-10-CM | POA: Diagnosis not present

## 2012-07-23 DIAGNOSIS — L280 Lichen simplex chronicus: Secondary | ICD-10-CM | POA: Diagnosis not present

## 2012-07-26 DIAGNOSIS — Z23 Encounter for immunization: Secondary | ICD-10-CM | POA: Diagnosis not present

## 2012-10-09 DIAGNOSIS — Z23 Encounter for immunization: Secondary | ICD-10-CM | POA: Diagnosis not present

## 2012-10-22 DIAGNOSIS — N63 Unspecified lump in unspecified breast: Secondary | ICD-10-CM | POA: Diagnosis not present

## 2013-04-15 DIAGNOSIS — N63 Unspecified lump in unspecified breast: Secondary | ICD-10-CM | POA: Diagnosis not present

## 2013-04-15 DIAGNOSIS — Z09 Encounter for follow-up examination after completed treatment for conditions other than malignant neoplasm: Secondary | ICD-10-CM | POA: Diagnosis not present

## 2013-05-01 DIAGNOSIS — R82998 Other abnormal findings in urine: Secondary | ICD-10-CM | POA: Diagnosis not present

## 2013-05-01 DIAGNOSIS — L820 Inflamed seborrheic keratosis: Secondary | ICD-10-CM | POA: Diagnosis not present

## 2013-05-01 DIAGNOSIS — M810 Age-related osteoporosis without current pathological fracture: Secondary | ICD-10-CM | POA: Diagnosis not present

## 2013-05-01 DIAGNOSIS — E785 Hyperlipidemia, unspecified: Secondary | ICD-10-CM | POA: Diagnosis not present

## 2013-05-01 DIAGNOSIS — E039 Hypothyroidism, unspecified: Secondary | ICD-10-CM | POA: Diagnosis not present

## 2013-05-01 DIAGNOSIS — M109 Gout, unspecified: Secondary | ICD-10-CM | POA: Diagnosis not present

## 2013-05-28 DIAGNOSIS — E785 Hyperlipidemia, unspecified: Secondary | ICD-10-CM | POA: Diagnosis not present

## 2013-05-28 DIAGNOSIS — M810 Age-related osteoporosis without current pathological fracture: Secondary | ICD-10-CM | POA: Diagnosis not present

## 2013-05-28 DIAGNOSIS — J309 Allergic rhinitis, unspecified: Secondary | ICD-10-CM | POA: Diagnosis not present

## 2013-05-28 DIAGNOSIS — M109 Gout, unspecified: Secondary | ICD-10-CM | POA: Diagnosis not present

## 2013-05-28 DIAGNOSIS — E039 Hypothyroidism, unspecified: Secondary | ICD-10-CM | POA: Diagnosis not present

## 2013-05-28 DIAGNOSIS — Z0000 Encounter for general adult medical examination without abnormal findings: Secondary | ICD-10-CM | POA: Diagnosis not present

## 2013-05-30 DIAGNOSIS — Z1212 Encounter for screening for malignant neoplasm of rectum: Secondary | ICD-10-CM | POA: Diagnosis not present

## 2013-07-23 DIAGNOSIS — M899 Disorder of bone, unspecified: Secondary | ICD-10-CM | POA: Diagnosis not present

## 2013-07-24 DIAGNOSIS — Z124 Encounter for screening for malignant neoplasm of cervix: Secondary | ICD-10-CM | POA: Diagnosis not present

## 2013-07-24 DIAGNOSIS — N8111 Cystocele, midline: Secondary | ICD-10-CM | POA: Diagnosis not present

## 2013-07-24 DIAGNOSIS — N952 Postmenopausal atrophic vaginitis: Secondary | ICD-10-CM | POA: Diagnosis not present

## 2013-07-24 DIAGNOSIS — B359 Dermatophytosis, unspecified: Secondary | ICD-10-CM | POA: Diagnosis not present

## 2013-07-30 DIAGNOSIS — Z23 Encounter for immunization: Secondary | ICD-10-CM | POA: Diagnosis not present

## 2013-09-08 DIAGNOSIS — Z23 Encounter for immunization: Secondary | ICD-10-CM | POA: Diagnosis not present

## 2013-10-10 DIAGNOSIS — Z1211 Encounter for screening for malignant neoplasm of colon: Secondary | ICD-10-CM | POA: Diagnosis not present

## 2013-10-10 DIAGNOSIS — K219 Gastro-esophageal reflux disease without esophagitis: Secondary | ICD-10-CM | POA: Diagnosis not present

## 2013-10-10 DIAGNOSIS — K900 Celiac disease: Secondary | ICD-10-CM | POA: Diagnosis not present

## 2013-10-10 DIAGNOSIS — R635 Abnormal weight gain: Secondary | ICD-10-CM | POA: Diagnosis not present

## 2013-10-10 DIAGNOSIS — R197 Diarrhea, unspecified: Secondary | ICD-10-CM | POA: Diagnosis not present

## 2013-10-16 DIAGNOSIS — N6009 Solitary cyst of unspecified breast: Secondary | ICD-10-CM | POA: Diagnosis not present

## 2013-10-16 DIAGNOSIS — Z09 Encounter for follow-up examination after completed treatment for conditions other than malignant neoplasm: Secondary | ICD-10-CM | POA: Diagnosis not present

## 2013-11-20 DIAGNOSIS — D126 Benign neoplasm of colon, unspecified: Secondary | ICD-10-CM | POA: Diagnosis not present

## 2013-11-20 DIAGNOSIS — R197 Diarrhea, unspecified: Secondary | ICD-10-CM | POA: Diagnosis not present

## 2013-11-20 DIAGNOSIS — Z1211 Encounter for screening for malignant neoplasm of colon: Secondary | ICD-10-CM | POA: Diagnosis not present

## 2013-11-20 DIAGNOSIS — K5730 Diverticulosis of large intestine without perforation or abscess without bleeding: Secondary | ICD-10-CM | POA: Diagnosis not present

## 2014-03-26 DIAGNOSIS — N63 Unspecified lump in unspecified breast: Secondary | ICD-10-CM | POA: Diagnosis not present

## 2014-06-04 DIAGNOSIS — M810 Age-related osteoporosis without current pathological fracture: Secondary | ICD-10-CM | POA: Diagnosis not present

## 2014-06-04 DIAGNOSIS — M109 Gout, unspecified: Secondary | ICD-10-CM | POA: Diagnosis not present

## 2014-06-04 DIAGNOSIS — E785 Hyperlipidemia, unspecified: Secondary | ICD-10-CM | POA: Diagnosis not present

## 2014-06-04 DIAGNOSIS — E039 Hypothyroidism, unspecified: Secondary | ICD-10-CM | POA: Diagnosis not present

## 2014-06-04 DIAGNOSIS — R82998 Other abnormal findings in urine: Secondary | ICD-10-CM | POA: Diagnosis not present

## 2014-06-04 DIAGNOSIS — N183 Chronic kidney disease, stage 3 unspecified: Secondary | ICD-10-CM | POA: Diagnosis not present

## 2014-06-12 DIAGNOSIS — N183 Chronic kidney disease, stage 3 unspecified: Secondary | ICD-10-CM | POA: Diagnosis not present

## 2014-06-12 DIAGNOSIS — E785 Hyperlipidemia, unspecified: Secondary | ICD-10-CM | POA: Diagnosis not present

## 2014-06-12 DIAGNOSIS — N8110 Cystocele, unspecified: Secondary | ICD-10-CM | POA: Diagnosis not present

## 2014-06-12 DIAGNOSIS — M109 Gout, unspecified: Secondary | ICD-10-CM | POA: Diagnosis not present

## 2014-06-12 DIAGNOSIS — Z0000 Encounter for general adult medical examination without abnormal findings: Secondary | ICD-10-CM | POA: Diagnosis not present

## 2014-06-12 DIAGNOSIS — E039 Hypothyroidism, unspecified: Secondary | ICD-10-CM | POA: Diagnosis not present

## 2014-06-12 DIAGNOSIS — Z1331 Encounter for screening for depression: Secondary | ICD-10-CM | POA: Diagnosis not present

## 2014-06-12 DIAGNOSIS — M810 Age-related osteoporosis without current pathological fracture: Secondary | ICD-10-CM | POA: Diagnosis not present

## 2014-06-12 DIAGNOSIS — K900 Celiac disease: Secondary | ICD-10-CM | POA: Diagnosis not present

## 2014-06-16 DIAGNOSIS — Z1212 Encounter for screening for malignant neoplasm of rectum: Secondary | ICD-10-CM | POA: Diagnosis not present

## 2014-09-26 DIAGNOSIS — Z23 Encounter for immunization: Secondary | ICD-10-CM | POA: Diagnosis not present

## 2014-10-06 DIAGNOSIS — N6002 Solitary cyst of left breast: Secondary | ICD-10-CM | POA: Diagnosis not present

## 2014-10-06 DIAGNOSIS — R928 Other abnormal and inconclusive findings on diagnostic imaging of breast: Secondary | ICD-10-CM | POA: Diagnosis not present

## 2014-10-06 DIAGNOSIS — Z09 Encounter for follow-up examination after completed treatment for conditions other than malignant neoplasm: Secondary | ICD-10-CM | POA: Diagnosis not present

## 2015-03-11 DIAGNOSIS — Z01419 Encounter for gynecological examination (general) (routine) without abnormal findings: Secondary | ICD-10-CM | POA: Diagnosis not present

## 2015-03-11 DIAGNOSIS — Z124 Encounter for screening for malignant neoplasm of cervix: Secondary | ICD-10-CM | POA: Diagnosis not present

## 2015-04-08 DIAGNOSIS — N63 Unspecified lump in breast: Secondary | ICD-10-CM | POA: Diagnosis not present

## 2015-04-08 DIAGNOSIS — N6002 Solitary cyst of left breast: Secondary | ICD-10-CM | POA: Diagnosis not present

## 2015-05-28 DIAGNOSIS — K219 Gastro-esophageal reflux disease without esophagitis: Secondary | ICD-10-CM | POA: Diagnosis not present

## 2015-05-28 DIAGNOSIS — Z86010 Personal history of colonic polyps: Secondary | ICD-10-CM | POA: Diagnosis not present

## 2015-05-28 DIAGNOSIS — K5730 Diverticulosis of large intestine without perforation or abscess without bleeding: Secondary | ICD-10-CM | POA: Diagnosis not present

## 2015-05-28 DIAGNOSIS — K900 Celiac disease: Secondary | ICD-10-CM | POA: Diagnosis not present

## 2015-06-08 DIAGNOSIS — N183 Chronic kidney disease, stage 3 (moderate): Secondary | ICD-10-CM | POA: Diagnosis not present

## 2015-06-08 DIAGNOSIS — R062 Wheezing: Secondary | ICD-10-CM | POA: Diagnosis not present

## 2015-06-08 DIAGNOSIS — R05 Cough: Secondary | ICD-10-CM | POA: Diagnosis not present

## 2015-06-08 DIAGNOSIS — Z6835 Body mass index (BMI) 35.0-35.9, adult: Secondary | ICD-10-CM | POA: Diagnosis not present

## 2015-06-08 DIAGNOSIS — H1089 Other conjunctivitis: Secondary | ICD-10-CM | POA: Diagnosis not present

## 2015-06-08 DIAGNOSIS — J309 Allergic rhinitis, unspecified: Secondary | ICD-10-CM | POA: Diagnosis not present

## 2015-06-08 DIAGNOSIS — J209 Acute bronchitis, unspecified: Secondary | ICD-10-CM | POA: Diagnosis not present

## 2015-06-18 DIAGNOSIS — E785 Hyperlipidemia, unspecified: Secondary | ICD-10-CM | POA: Diagnosis not present

## 2015-06-18 DIAGNOSIS — E039 Hypothyroidism, unspecified: Secondary | ICD-10-CM | POA: Diagnosis not present

## 2015-06-18 DIAGNOSIS — M810 Age-related osteoporosis without current pathological fracture: Secondary | ICD-10-CM | POA: Diagnosis not present

## 2015-06-18 DIAGNOSIS — N189 Chronic kidney disease, unspecified: Secondary | ICD-10-CM | POA: Diagnosis not present

## 2015-06-18 DIAGNOSIS — M109 Gout, unspecified: Secondary | ICD-10-CM | POA: Diagnosis not present

## 2015-06-25 DIAGNOSIS — K900 Celiac disease: Secondary | ICD-10-CM | POA: Diagnosis not present

## 2015-06-25 DIAGNOSIS — Z6834 Body mass index (BMI) 34.0-34.9, adult: Secondary | ICD-10-CM | POA: Diagnosis not present

## 2015-06-25 DIAGNOSIS — E785 Hyperlipidemia, unspecified: Secondary | ICD-10-CM | POA: Diagnosis not present

## 2015-06-25 DIAGNOSIS — K649 Unspecified hemorrhoids: Secondary | ICD-10-CM | POA: Diagnosis not present

## 2015-06-25 DIAGNOSIS — Z0000 Encounter for general adult medical examination without abnormal findings: Secondary | ICD-10-CM | POA: Diagnosis not present

## 2015-06-25 DIAGNOSIS — N183 Chronic kidney disease, stage 3 (moderate): Secondary | ICD-10-CM | POA: Diagnosis not present

## 2015-06-25 DIAGNOSIS — M109 Gout, unspecified: Secondary | ICD-10-CM | POA: Diagnosis not present

## 2015-06-25 DIAGNOSIS — Z1389 Encounter for screening for other disorder: Secondary | ICD-10-CM | POA: Diagnosis not present

## 2015-06-25 DIAGNOSIS — E039 Hypothyroidism, unspecified: Secondary | ICD-10-CM | POA: Diagnosis not present

## 2015-06-25 DIAGNOSIS — N8189 Other female genital prolapse: Secondary | ICD-10-CM | POA: Diagnosis not present

## 2015-06-25 DIAGNOSIS — M810 Age-related osteoporosis without current pathological fracture: Secondary | ICD-10-CM | POA: Diagnosis not present

## 2015-06-25 DIAGNOSIS — K5730 Diverticulosis of large intestine without perforation or abscess without bleeding: Secondary | ICD-10-CM | POA: Diagnosis not present

## 2015-07-01 DIAGNOSIS — Z1212 Encounter for screening for malignant neoplasm of rectum: Secondary | ICD-10-CM | POA: Diagnosis not present

## 2015-07-27 DIAGNOSIS — M810 Age-related osteoporosis without current pathological fracture: Secondary | ICD-10-CM | POA: Diagnosis not present

## 2015-07-27 DIAGNOSIS — N189 Chronic kidney disease, unspecified: Secondary | ICD-10-CM | POA: Diagnosis not present

## 2015-09-02 DIAGNOSIS — Z23 Encounter for immunization: Secondary | ICD-10-CM | POA: Diagnosis not present

## 2015-09-23 DIAGNOSIS — H52203 Unspecified astigmatism, bilateral: Secondary | ICD-10-CM | POA: Diagnosis not present

## 2015-09-23 DIAGNOSIS — Z961 Presence of intraocular lens: Secondary | ICD-10-CM | POA: Diagnosis not present

## 2015-10-12 DIAGNOSIS — N6002 Solitary cyst of left breast: Secondary | ICD-10-CM | POA: Diagnosis not present

## 2015-11-11 ENCOUNTER — Emergency Department (HOSPITAL_COMMUNITY): Payer: Medicare Other

## 2015-11-11 ENCOUNTER — Emergency Department (HOSPITAL_COMMUNITY)
Admission: EM | Admit: 2015-11-11 | Discharge: 2015-11-11 | Disposition: A | Discharge status: Home / Self Care | Payer: Medicare Other | Attending: Emergency Medicine | Admitting: Emergency Medicine

## 2015-11-11 ENCOUNTER — Encounter (HOSPITAL_COMMUNITY): Payer: Self-pay | Admitting: Emergency Medicine

## 2015-11-11 DIAGNOSIS — J45909 Unspecified asthma, uncomplicated: Secondary | ICD-10-CM | POA: Insufficient documentation

## 2015-11-11 DIAGNOSIS — S4992XA Unspecified injury of left shoulder and upper arm, initial encounter: Secondary | ICD-10-CM | POA: Diagnosis not present

## 2015-11-11 DIAGNOSIS — Y998 Other external cause status: Secondary | ICD-10-CM | POA: Insufficient documentation

## 2015-11-11 DIAGNOSIS — S99912A Unspecified injury of left ankle, initial encounter: Secondary | ICD-10-CM | POA: Insufficient documentation

## 2015-11-11 DIAGNOSIS — S60512A Abrasion of left hand, initial encounter: Secondary | ICD-10-CM | POA: Insufficient documentation

## 2015-11-11 DIAGNOSIS — Y9389 Activity, other specified: Secondary | ICD-10-CM | POA: Insufficient documentation

## 2015-11-11 DIAGNOSIS — M25512 Pain in left shoulder: Secondary | ICD-10-CM | POA: Diagnosis not present

## 2015-11-11 DIAGNOSIS — S79922A Unspecified injury of left thigh, initial encounter: Secondary | ICD-10-CM | POA: Diagnosis not present

## 2015-11-11 DIAGNOSIS — Z8639 Personal history of other endocrine, nutritional and metabolic disease: Secondary | ICD-10-CM | POA: Insufficient documentation

## 2015-11-11 DIAGNOSIS — S62002A Unspecified fracture of navicular [scaphoid] bone of left wrist, initial encounter for closed fracture: Secondary | ICD-10-CM

## 2015-11-11 DIAGNOSIS — Z8719 Personal history of other diseases of the digestive system: Secondary | ICD-10-CM | POA: Diagnosis not present

## 2015-11-11 DIAGNOSIS — W010XXA Fall on same level from slipping, tripping and stumbling without subsequent striking against object, initial encounter: Secondary | ICD-10-CM | POA: Diagnosis not present

## 2015-11-11 DIAGNOSIS — S6992XA Unspecified injury of left wrist, hand and finger(s), initial encounter: Secondary | ICD-10-CM | POA: Diagnosis present

## 2015-11-11 DIAGNOSIS — Y9289 Other specified places as the place of occurrence of the external cause: Secondary | ICD-10-CM | POA: Diagnosis not present

## 2015-11-11 DIAGNOSIS — Z8739 Personal history of other diseases of the musculoskeletal system and connective tissue: Secondary | ICD-10-CM | POA: Insufficient documentation

## 2015-11-11 DIAGNOSIS — S62012A Displaced fracture of distal pole of navicular [scaphoid] bone of left wrist, initial encounter for closed fracture: Secondary | ICD-10-CM | POA: Diagnosis not present

## 2015-11-11 HISTORY — DX: Celiac disease: K90.0

## 2015-11-11 HISTORY — DX: Unspecified asthma, uncomplicated: J45.909

## 2015-11-11 HISTORY — DX: Age-related osteoporosis without current pathological fracture: M81.0

## 2015-11-11 HISTORY — DX: Disorder of thyroid, unspecified: E07.9

## 2015-11-11 MED ORDER — HYDROCODONE-ACETAMINOPHEN 5-325 MG PO TABS: 1.0000 | ORAL_TABLET | Freq: Four times a day (QID) | ORAL | Status: DC | PRN

## 2015-11-11 MED ORDER — ACETAMINOPHEN 325 MG PO TABS
650.0000 mg | ORAL_TABLET | Freq: Once | ORAL | Status: AC
  Administered 2015-11-11: 650 mg via ORAL
  Filled 2015-11-11: qty 2

## 2015-11-11 NOTE — Discharge Instructions (Signed)
Cast or Splint Care °Casts and splints support injured limbs and keep bones from moving while they heal. It is important to care for your cast or splint at home.   °HOME CARE INSTRUCTIONS °· Keep the cast or splint uncovered during the drying period. It can take 24 to 48 hours to dry if it is made of plaster. A fiberglass cast will dry in less than 1 hour. °· Do not rest the cast on anything harder than a pillow for the first 24 hours. °· Do not put weight on your injured limb or apply pressure to the cast until your health care provider gives you permission. °· Keep the cast or splint dry. Wet casts or splints can lose their shape and may not support the limb as well. A wet cast that has lost its shape can also create harmful pressure on your skin when it dries. Also, wet skin can become infected. °· Cover the cast or splint with a plastic bag when bathing or when out in the rain or snow. If the cast is on the trunk of the body, take sponge baths until the cast is removed. °· If your cast does become wet, dry it with a towel or a blow dryer on the cool setting only. °· Keep your cast or splint clean. Soiled casts may be wiped with a moistened cloth. °· Do not place any hard or soft foreign objects under your cast or splint, such as cotton, toilet paper, lotion, or powder. °· Do not try to scratch the skin under the cast with any object. The object could get stuck inside the cast. Also, scratching could lead to an infection. If itching is a problem, use a blow dryer on a cool setting to relieve discomfort. °· Do not trim or cut your cast or remove padding from inside of it. °· Exercise all joints next to the injury that are not immobilized by the cast or splint. For example, if you have a long leg cast, exercise the hip joint and toes. If you have an arm cast or splint, exercise the shoulder, elbow, thumb, and fingers. °· Elevate your injured arm or leg on 1 or 2 pillows for the first 1 to 3 days to decrease  swelling and pain. It is best if you can comfortably elevate your cast so it is higher than your heart. °SEEK MEDICAL CARE IF:  °· Your cast or splint cracks. °· Your cast or splint is too tight or too loose. °· You have unbearable itching inside the cast. °· Your cast becomes wet or develops a soft spot or area. °· You have a bad smell coming from inside your cast. °· You get an object stuck under your cast. °· Your skin around the cast becomes red or raw. °· You have new pain or worsening pain after the cast has been applied. °SEEK IMMEDIATE MEDICAL CARE IF:  °· You have fluid leaking through the cast. °· You are unable to move your fingers or toes. °· You have discolored (blue or white), cool, painful, or very swollen fingers or toes beyond the cast. °· You have tingling or numbness around the injured area. °· You have severe pain or pressure under the cast. °· You have any difficulty with your breathing or have shortness of breath. °· You have chest pain. °  °This information is not intended to replace advice given to you by your health care provider. Make sure you discuss any questions you have with your health care   provider.   Document Released: 12/09/2000 Document Revised: 10/02/2013 Document Reviewed: 06/20/2013 Elsevier Interactive Patient Education 2016 Elsevier Inc[employer]  Scaphoid Fracture, Wrist A fracture is a break in the bone. The bone you have broken often does not show up as a fracture on x-ray until later on in the healing phase. This bone is called the scaphoid bone. With this bone, your caregiver will often cast or splint your wrist as though it is fractured, even if a fracture is not seen on the x-ray. This is often done with wrist injuries in which there is tenderness at the base of the thumb. An x-ray at 1-3 weeks after your injury may confirm this fracture. A cast or splint is used to protect and keep your injured bone in good position for healing. The cast or splint will be on generally  for about 6 to 16 weeks, depending on your health, age, the fracture location and how quickly you heal. Another name for the scaphoid bone is the navicular bone. HOME CARE INSTRUCTIONS   To lessen the swelling and pain, keep the injured part elevated above your heart while sitting or lying down.  Apply ice to the injury for 15-20 minutes, 03-04 times per day while awake, for 2 days. Put the ice in a plastic bag and place a thin towel between the bag of ice and your cast.  If you have a plaster or fiberglass cast or splint:  Do not try to scratch the skin under the cast using sharp or pointed objects.  Check the skin around the cast every day. You may put lotion on any red or sore areas.  Keep your cast or splint dry and clean.  If you have a plaster splint:  Wear the splint as directed.  You may loosen the elastic bandage around the splint if your fingers become numb, tingle, or turn cold or blue.  If you have been put in a removable splint, wear and use as directed.  Do not put pressure on any part of your cast or splint; it may deform or break. Rest your cast or splint only on a pillow the first 24 hours until it is fully hardened.  Your cast or splint can be protected during bathing with a plastic bag. Do not lower the cast or splint into water.  Only take over-the-counter or prescription medicines for pain, discomfort, or fever as directed by your caregiver.  If your caregiver has given you a follow up appointment, it is very important to keep that appointment. Not keeping the appointment could result in chronic pain and decreased function. If there is any problem keeping the appointment, you must call back to this facility for assistance. SEEK IMMEDIATE MEDICAL CARE IF:   Your cast gets damaged, wet or breaks.  You have continued severe pain or more swelling than you did before the cast or splint was put on.  Your skin or nails below the injury turn blue or gray, or feel cold  or numb.  You have tingling or burning pain in your fingers or increasing pain with movement of your fingers   This information is not intended to replace advice given to you by your health care provider. Make sure you discuss any questions you have with your health care provider.   Document Released: 12/02/2002 Document Revised: 03/05/2012 Document Reviewed: 06/24/2015 Elsevier Interactive Patient Education 2016 Elsevier Inc.  Shoulder Pain The shoulder is the joint that connects your arms to your body. The bones that  form the shoulder joint include the upper arm bone (humerus), the shoulder blade (scapula), and the collarbone (clavicle). The top of the humerus is shaped like a ball and fits into a rather flat socket on the scapula (glenoid cavity). A combination of muscles and strong, fibrous tissues that connect muscles to bones (tendons) support your shoulder joint and hold the ball in the socket. Small, fluid-filled sacs (bursae) are located in different areas of the joint. They act as cushions between the bones and the overlying soft tissues and help reduce friction between the gliding tendons and the bone as you move your arm. Your shoulder joint allows a wide range of motion in your arm. This range of motion allows you to do things like scratch your back or throw a ball. However, this range of motion also makes your shoulder more prone to pain from overuse and injury. Causes of shoulder pain can originate from both injury and overuse and usually can be grouped in the following four categories:  Redness, swelling, and pain (inflammation) of the tendon (tendinitis) or the bursae (bursitis).  Instability, such as a dislocation of the joint.  Inflammation of the joint (arthritis).  Broken bone (fracture). HOME CARE INSTRUCTIONS   Apply ice to the sore area.  Put ice in a plastic bag.  Place a towel between your skin and the bag.  Leave the ice on for 15-20 minutes, 3-4 times per day  for the first 2 days, or as directed by your health care provider.  Stop using cold packs if they do not help with the pain.  If you have a shoulder sling or immobilizer, wear it as long as your caregiver instructs. Only remove it to shower or bathe. Move your arm as little as possible, but keep your hand moving to prevent swelling.  Squeeze a soft ball or foam pad as much as possible to help prevent swelling.  Only take over-the-counter or prescription medicines for pain, discomfort, or fever as directed by your caregiver. SEEK MEDICAL CARE IF:   Your shoulder pain increases, or new pain develops in your arm, hand, or fingers.  Your hand or fingers become cold and numb.  Your pain is not relieved with medicines. SEEK IMMEDIATE MEDICAL CARE IF:   Your arm, hand, or fingers are numb or tingling.  Your arm, hand, or fingers are significantly swollen or turn white or blue. MAKE SURE YOU:   Understand these instructions.  Will watch your condition.  Will get help right away if you are not doing well or get worse.   This information is not intended to replace advice given to you by your health care provider. Make sure you discuss any questions you have with your health care provider.   Document Released: 09/21/2005 Document Revised: 01/02/2015 Document Reviewed: 04/06/2015 Elsevier Interactive Patient Education [employer].

## 2015-11-11 NOTE — ED Provider Notes (Signed)
CSN: [id number]     Arrival date & time 11/11/15  1548 History  By signing my name below, I, Jessica Esparza, attest that this documentation has been prepared under the direction and in the presence of Gloriann Loan, PA-C. Electronically Signed: Rayna Esparza, ED Scribe. 11/11/2015. 4:47 PM.   Chief Complaint  Patient presents with  . Fall  . Wrist Injury   The history is provided by the patient. No language interpreter was used.    HPI Comments: Jessica Esparza is a 74 y.o. female with a hx of osteoporosis who presents to the Emergency Department due to a fall that occurred at 12:00 PM. Pt notes that she fell to her left side after tripping on an uneven surface on the concrete and attempted to catch her fall with her left hand before landing on the concrete below. She notes associated, moderate, left wrist pain that worsens with movement, mild left shoulder pain, mild left lateral thigh pain and mild left ankle pain. Pt notes taking 2x Extra Strength Tylenol 5 hours ago immediately following her fall which she says provided short term relief of her pain. She confirms her listed allergies. She denies head trauma, LOC, lightheadedness, dizziness, SOB, numbness, tingling, abd pain and CP.   Past Medical History  Diagnosis Date  . Osteoporosis   . Thyroid disease   . Celiac disease   . Asthma    Past Surgical History  Procedure Laterality Date  . Abdominal hysterectomy    . Cholecystectomy    . Sinus exploration     History reviewed. No pertinent family history. Social History  Substance Use Topics  . Smoking status: None  . Smokeless tobacco: None  . Alcohol Use: None   OB History    No data available     Review of Systems A complete 10 system review of systems was obtained and all systems are negative except as noted in the HPI and PMH.  Allergies  Nsaids  Home Medications   Prior to Admission medications   Medication Sig Start Date End Date Taking? Authorizing Provider   HYDROcodone-acetaminophen (NORCO/VICODIN) 5-325 MG tablet Take 1-2 tablets by mouth every 6 (six) hours as needed. 11/11/15   Kalisha Keadle, PA-C   BP 141/77 mmHg  Pulse 79  Temp(Src) 97.9 F (36.6 C) (Oral)  Resp 18  SpO2 99% Physical Exam  Constitutional: She is oriented to person, place, and time. She appears well-developed and well-nourished.  HENT:  Head: Normocephalic and atraumatic.  Mouth/Throat: Oropharynx is clear and moist. No oropharyngeal exudate.  Eyes: Conjunctivae are normal.  Neck: Normal range of motion. Neck supple. No tracheal deviation present.  Cardiovascular: Normal rate, regular rhythm, normal heart sounds and intact distal pulses.   Pulses:      Radial pulses are 2+ on the right side, and 2+ on the left side.       Posterior tibial pulses are 2+ on the right side, and 2+ on the left side.  Capillary refill less than 3 seconds.   Pulmonary/Chest: Effort normal and breath sounds normal. No respiratory distress. She has no wheezes. She has no rales. She exhibits no tenderness.  Abdominal: Soft. Bowel sounds are normal. She exhibits no distension. There is no tenderness.  Musculoskeletal:       Right shoulder: Normal.       Left shoulder: She exhibits pain. She exhibits normal range of motion, no tenderness, no bony tenderness, no swelling, no effusion, no crepitus, no deformity, no laceration, no  spasm, normal pulse and normal strength.       Right elbow: Normal.      Left elbow: Normal.       Right wrist: Normal.       Left wrist: She exhibits tenderness and bony tenderness. She exhibits normal range of motion, no swelling, no effusion, no crepitus, no deformity and no laceration (small superficial abrasion on volar aspect of thenar eminence).       Right hip: Normal.       Left hip: Normal.       Right knee: Normal.       Left knee: Normal.       Right ankle: Normal.       Left ankle: Normal.       Right hand: Normal.       Left hand: She exhibits decreased  range of motion (secondary to pain), tenderness and bony tenderness (no snuffbox tenderness). She exhibits normal capillary refill, no deformity, no laceration and no swelling. Normal sensation noted. Normal strength noted.       Hands: Neurological: She is alert and oriented to person, place, and time.  Strength and sensation intact bilaterally in upper and lower extremities.   Skin: Skin is warm and dry. She is not diaphoretic.  No bruising, ecchymosis, lacerations, or hematomas.   Psychiatric: She has a normal mood and affect. Her behavior is normal.  Nursing note and vitals reviewed.  ED Course  Procedures  DIAGNOSTIC STUDIES: Oxygen Saturation is 99% on RA, normal by my interpretation.    COORDINATION OF CARE: 4:46 PM Pt presents today due to associated injuries from a fall earlier today. Discussed next steps with pt at bedside including reevaluation based on imaging results. Pt agreed to plan.  Labs Review Labs Reviewed - No data to display  Imaging Review Dg Wrist Complete Left  11/11/2015  CLINICAL DATA:  Fall today.  Rule out fracture. EXAM: LEFT WRIST - COMPLETE 3+ VIEW COMPARISON:  Left hand from today FINDINGS: Negative for fracture. Mild degenerative change in the radiocarpal joint with joint space narrowing and a small cyst in the lunate bone. IMPRESSION: Negative for fracture. Mild degenerative change in the radiocarpal joint. Electronically Signed   By: Franchot Gallo M.D.   On: 11/11/2015 16:46   Dg Shoulder Left  11/11/2015  CLINICAL DATA:  [place] [address] today on uneven pavement at [facility], tried to catch herself with her LEFT arm, LEFT shoulder pain, initial encounter EXAM: LEFT SHOULDER - 2+ VIEW COMPARISON:  None FINDINGS: Osseous demineralization. AC joint alignment normal. No acute fracture, dislocation, or bone destruction. Questionable fractures of the anterolateral LEFT first and second ribs. IMPRESSION: No acute LEFT shoulder abnormalities. Osseous  demineralization with questionable age-indeterminate fractures of the LEFT distal first and second ribs. Electronically Signed   By: Lavonia Dana M.D.   On: 11/11/2015 16:48   Dg Hand Complete Left  11/11/2015  CLINICAL DATA:  [place] [address] today over on even pavement at [facility], tried to catch self with LEFT arm, abrasion LEFT palm with LEFT palm and wrist pain EXAM: LEFT HAND - COMPLETE 3+ VIEW COMPARISON:  None FINDINGS: Diffuse osseous demineralization. Scattered mild narrowing of IP joints. Remain joint spaces preserved. Tiny subchondral cyst at proximal pole of lunate. Questionable nondisplaced fracture at distal pole of scaphoid. No additional fracture, dislocation or bone destruction. IMPRESSION: Questionable nondisplaced fracture at the scaphoid. Electronically Signed   By: Lavonia Dana M.D.   On: 11/11/2015 16:46   I have  personally reviewed and evaluated these images as part of my medical decision-making.   EKG Interpretation None      MDM   Final diagnoses:  Scaphoid fracture of wrist, left, closed, initial encounter  Left shoulder pain   Patient presents with fall landing on left wrist now complaining of left shoulder pain and left wrist pain.  VSS, NAD.  On exam, neurovascularly intact.  TTP over radial aspect of dorsal wrist.  No anatomical snuffbox tenderness.  Left shoulder with TTP, but no decreased ROM of strength.  Plain films show evidence of questionable scaphoid fracture and questionable left fractures of the 1st and 2nd ribs.  Will apply thumb spica splint and ortho follow up.  Doubt 1st and 2nd rib fxs, patient is nontender in 1st or 2nd rib distribution.  Tylenol for pain.   Patient reports she has already made a follow up appointment with Dr. Gavin Pound with [facility] on Friday.  Evaluation does not show pathology requring ongoing emergent intervention or admission. Pt is hemodynamically stable and mentating appropriately. Discussed findings/results and  plan with patient/guardian, who agrees with plan. All questions answered. Return precautions discussed and outpatient follow up given.    I personally performed the services described in this documentation, which was scribed in my presence. The recorded information has been reviewed and is accurate.    Gloriann Loan, PA-C 11/11/15 1803  Davonna Belling, MD 11/12/15 [phone]

## 2015-11-11 NOTE — ED Notes (Addendum)
Pt fell today over uneven pavement. Tried to catch self with left arm, small superficial abrasion to left palm. Pain in left palm/wrist as well as in left shoulder. No obvious bruising/deformities/swelling observed. Pain with movement of fingers to left hand. Denies being on any blood thinners or hitting head.

## 2015-11-13 DIAGNOSIS — S62012A Displaced fracture of distal pole of navicular [scaphoid] bone of left wrist, initial encounter for closed fracture: Secondary | ICD-10-CM | POA: Diagnosis not present

## 2015-11-27 DIAGNOSIS — S62012D Displaced fracture of distal pole of navicular [scaphoid] bone of left wrist, subsequent encounter for fracture with routine healing: Secondary | ICD-10-CM | POA: Diagnosis not present

## 2015-12-24 DIAGNOSIS — S62012D Displaced fracture of distal pole of navicular [scaphoid] bone of left wrist, subsequent encounter for fracture with routine healing: Secondary | ICD-10-CM | POA: Diagnosis not present

## 2016-03-10 DIAGNOSIS — L570 Actinic keratosis: Secondary | ICD-10-CM | POA: Diagnosis not present

## 2016-03-10 DIAGNOSIS — L718 Other rosacea: Secondary | ICD-10-CM | POA: Diagnosis not present

## 2016-07-06 DIAGNOSIS — R8299 Other abnormal findings in urine: Secondary | ICD-10-CM | POA: Diagnosis not present

## 2016-07-06 DIAGNOSIS — M109 Gout, unspecified: Secondary | ICD-10-CM | POA: Diagnosis not present

## 2016-07-06 DIAGNOSIS — E784 Other hyperlipidemia: Secondary | ICD-10-CM | POA: Diagnosis not present

## 2016-07-06 DIAGNOSIS — E038 Other specified hypothyroidism: Secondary | ICD-10-CM | POA: Diagnosis not present

## 2016-07-06 DIAGNOSIS — M810 Age-related osteoporosis without current pathological fracture: Secondary | ICD-10-CM | POA: Diagnosis not present

## 2016-07-06 DIAGNOSIS — N183 Chronic kidney disease, stage 3 (moderate): Secondary | ICD-10-CM | POA: Diagnosis not present

## 2016-07-06 DIAGNOSIS — N390 Urinary tract infection, site not specified: Secondary | ICD-10-CM | POA: Diagnosis not present

## 2016-07-25 DIAGNOSIS — R011 Cardiac murmur, unspecified: Secondary | ICD-10-CM | POA: Diagnosis not present

## 2016-07-25 DIAGNOSIS — K900 Celiac disease: Secondary | ICD-10-CM | POA: Diagnosis not present

## 2016-07-25 DIAGNOSIS — E038 Other specified hypothyroidism: Secondary | ICD-10-CM | POA: Diagnosis not present

## 2016-07-25 DIAGNOSIS — M810 Age-related osteoporosis without current pathological fracture: Secondary | ICD-10-CM | POA: Diagnosis not present

## 2016-07-25 DIAGNOSIS — N8189 Other female genital prolapse: Secondary | ICD-10-CM | POA: Diagnosis not present

## 2016-07-25 DIAGNOSIS — Z1389 Encounter for screening for other disorder: Secondary | ICD-10-CM | POA: Diagnosis not present

## 2016-07-25 DIAGNOSIS — Z6835 Body mass index (BMI) 35.0-35.9, adult: Secondary | ICD-10-CM | POA: Diagnosis not present

## 2016-07-25 DIAGNOSIS — E784 Other hyperlipidemia: Secondary | ICD-10-CM | POA: Diagnosis not present

## 2016-07-25 DIAGNOSIS — M109 Gout, unspecified: Secondary | ICD-10-CM | POA: Diagnosis not present

## 2016-07-25 DIAGNOSIS — K5730 Diverticulosis of large intestine without perforation or abscess without bleeding: Secondary | ICD-10-CM | POA: Diagnosis not present

## 2016-07-25 DIAGNOSIS — Z0000 Encounter for general adult medical examination without abnormal findings: Secondary | ICD-10-CM | POA: Diagnosis not present

## 2016-07-25 DIAGNOSIS — N183 Chronic kidney disease, stage 3 (moderate): Secondary | ICD-10-CM | POA: Diagnosis not present

## 2016-08-01 DIAGNOSIS — Z1212 Encounter for screening for malignant neoplasm of rectum: Secondary | ICD-10-CM | POA: Diagnosis not present

## 2016-09-16 DIAGNOSIS — Z23 Encounter for immunization: Secondary | ICD-10-CM | POA: Diagnosis not present

## 2016-09-20 DIAGNOSIS — C44722 Squamous cell carcinoma of skin of right lower limb, including hip: Secondary | ICD-10-CM | POA: Diagnosis not present

## 2016-09-20 DIAGNOSIS — C44629 Squamous cell carcinoma of skin of left upper limb, including shoulder: Secondary | ICD-10-CM | POA: Diagnosis not present

## 2016-09-20 DIAGNOSIS — D485 Neoplasm of uncertain behavior of skin: Secondary | ICD-10-CM | POA: Diagnosis not present

## 2016-10-04 ENCOUNTER — Ambulatory Visit (INDEPENDENT_AMBULATORY_CARE_PROVIDER_SITE_OTHER): Payer: Medicare Other | Admitting: Family Medicine

## 2016-10-04 VITALS — BP 143/83 | HR 96 | Resp 16

## 2016-10-04 DIAGNOSIS — R040 Epistaxis: Secondary | ICD-10-CM

## 2016-10-04 LAB — POCT CBC
GRANULOCYTE PERCENT: 70.8 % (ref 37–80)
HEMATOCRIT: 43.4 % (ref 37.7–47.9)
Hemoglobin: 14.9 g/dL (ref 12.2–16.2)
Lymph, poc: 2.6 (ref 0.6–3.4)
MCH: 28.9 pg (ref 27–31.2)
MCHC: 34.3 g/dL (ref 31.8–35.4)
MCV: 84.4 fL (ref 80–97)
MID (CBC): 0.2 (ref 0–0.9)
MPV: 9.2 fL (ref 0–99.8)
POC GRANULOCYTE: 6.9 (ref 2–6.9)
POC LYMPH PERCENT: 26.7 %L (ref 10–50)
POC MID %: 2.5 % (ref 0–12)
Platelet Count, POC: 187 10*3/uL (ref 142–424)
RBC: 5.14 M/uL (ref 4.04–5.48)
RDW, POC: 15.1 %
WBC: 9.7 10*3/uL (ref 4.6–10.2)

## 2016-10-04 NOTE — Patient Instructions (Addendum)
   IF you received an x-ray today, you will receive an invoice from [facility]. Please contact [facility] at [phone] with questions or concerns regarding your invoice.   IF you received labwork today, you will receive an invoice from [facility]. Please contact [facility] at [phone] with questions or concerns regarding your invoice.   Our billing staff will not be able to assist you with questions regarding bills from these companies.  You will be contacted with the lab results as soon as they are available. The fastest way to get your results is to activate your My Chart account. Instructions are located on the last page of this paperwork. If you have not heard from us regarding the results in 2 weeks, please contact this office.    Nosebleed Nosebleeds are common. They are due to a crack in the inside lining of your nose (mucous membrane) or from a small blood vessel that starts to bleed. Nosebleeds can be caused by many conditions, such as injury, infections, dry mucous membranes or dry climate, medicines, nose picking, and home heating and cooling systems. Most nosebleeds come from blood vessels in the front of your nose. HOME CARE INSTRUCTIONS   Try controlling your nosebleed by pinching your nostrils gently and continuously for at least 10 minutes.  Avoid blowing or sniffing your nose for a number of hours after having a nosebleed.  Do not put gauze inside your nose yourself. If your nose was packed by your health care provider, try to maintain the pack inside of your nose until your health care provider removes it.  If a gauze pack was used and it starts to fall out, gently replace it or cut off the end of it.  If a balloon catheter was used to pack your nose, do not cut or remove it unless your health care provider has instructed you to do that.  Avoid lying down while you are having a nosebleed. Sit up and lean forward.  Use  a nasal spray decongestant to help with a nosebleed as directed by your health care provider.  Do not use petroleum jelly or mineral oil in your nose. These can drip into your lungs.  Maintain humidity in your home by using less air conditioning or by using a humidifier.  Aspirinand blood thinners make bleeding more likely. If you are prescribed these medicines and you suffer from nosebleeds, ask your health care provider if you should stop taking the medicines or adjust the dose. Do not stop medicines unless directed by your health care provider  Resume your normal activities as you are able, but avoid straining, lifting, or bending at the waist for several days.  If your nosebleed was caused by dry mucous membranes, use over-the-counter saline nasal spray or gel. This will keep the mucous membranes moist and allow them to heal. If you must use a lubricant, choose the water-soluble variety. Use it only sparingly, and do not use it within several hours of lying down.  Keep all follow-up visits as directed by your health care provider. This is important. SEEK MEDICAL CARE IF:  You have a fever.  You get frequent nosebleeds.  You are getting nosebleeds more often. SEEK IMMEDIATE MEDICAL CARE IF:  Your nosebleed lasts longer than 20 minutes.  Your nosebleed occurs after an injury to your face, and your nose looks crooked or broken.  You have unusual bleeding from other parts of your body.  You have unusual bruising on other   parts of your body.  You feel light-headed or you faint.  You become sweaty.  You vomit blood.  Your nosebleed occurs after a head injury.   This information is not intended to replace advice given to you by your health care provider. Make sure you discuss any questions you have with your health care provider.   Document Released: 09/21/2005 Document Revised: 01/02/2015 Document Reviewed: 07/28/2014 Elsevier Interactive Patient Education 2016 Elsevier  Inc.  

## 2016-10-04 NOTE — Progress Notes (Signed)
  Chief Complaint  Patient presents with  . Epistaxis    10/9 and 10/04/16    HPI   Acute Epistaxis: Patient presents with a nosebleed from bilateral nostril.  It is associated with no specific cause.   She reports that she had one episode of nose yesterday that stopped with astelin spray. She denies aspirin or nsaids Is not on any blood thinner Has not been picking the nose She denies trauma No other unusual bleeding   Past Medical History:  Diagnosis Date  . Asthma   . Celiac disease   . Osteoporosis   . Thyroid disease     Current Outpatient Prescriptions  Medication Sig Dispense Refill  . allopurinol (ZYLOPRIM) 100 MG tablet Take 100 mg by mouth daily.    . Fluticasone Propionate (FLONASE NA) Place into the nose 2 (two) times daily.    . Levothyroxine Sodium (SYNTHROID PO) Take 150 mcg by mouth daily.     . Mometasone Furoate (ASMANEX HFA IN) Inhale into the lungs daily.    . simvastatin (ZOCOR) 20 MG tablet Take 20 mg by mouth daily.    . sucralfate (CARAFATE) 1 g tablet Take 1 g by mouth 4 (four) times daily -  with meals and at bedtime.    Marland Kitchen HYDROcodone-acetaminophen (NORCO/VICODIN) 5-325 MG tablet Take 1-2 tablets by mouth every 6 (six) hours as needed. (Patient not taking: Reported on 10/04/2016) 6 tablet 0   No current facility-administered medications for this visit.     Allergies:  Allergies  Allergen Reactions  . Nsaids Shortness Of Breath    Wheezing    Past Surgical History:  Procedure Laterality Date  . ABDOMINAL HYSTERECTOMY    . CHOLECYSTECTOMY    . SINUS EXPLORATION      Social History   Social History  . Marital status: Married    Spouse name: N/A  . Number of children: N/A  . Years of education: N/A   Social History Main Topics  . Smoking status: Former Research scientist (life sciences)  . Smokeless tobacco: Never Used  . Alcohol use Not on file  . Drug use: Unknown  . Sexual activity: Not on file   Other Topics Concern  . Not on file   Social History  Narrative  . No narrative on file    ROS  Objective: Vitals:   10/04/16 1519  BP: (!) 143/83  Pulse: 96  Resp: 16  SpO2: 96%    Physical Exam Gen: alert and oriented Head: atraumatic, normocephalic Eyes: normal conjunctiva, EOM intact Nose: left nare with bleeding for pin point area in the medial wall of the anterior nasal canal Right nare with bleeding in the entire wall of the medial aspect of the anterior nasal canal     Assessment and Plan Momina was seen today for epistaxis.  Diagnoses and all orders for this visit:  Epistaxis- unknown cause, platelets normal -     POCT CBC -     Ambulatory referral to ENT   Rhino rocket inserted in the right nostril without complications  Cautery to the left nostril Pt discharged home  Follow up with ENT in 24-48 hr Stat referral placed  Leslie

## 2016-10-05 ENCOUNTER — Telehealth: Payer: Self-pay

## 2016-10-05 NOTE — Telephone Encounter (Signed)
Called patient back with the appointment time and date after Dr. Janace Hoard office called me.  LMVM because no one answered.  Tried cell and home.  LM on home number.  Appointment is Monday, October 16th at 10:30.

## 2016-10-05 NOTE — Telephone Encounter (Signed)
Called Dr. Janace Hoard office this AM as per Dr. Nolon Rod request.  They stated that a rhino rocket has to stay in place for a total of 5 days and since it was placed yesterday, that they would not be able to see the patient today.  They further stated that they were trying to decide whether to see the patient on Friday or Monday.  They will call us with an appointment when they decide on the day.  I called Ms. Jessica Esparza and advised her of the issue and told her we will call her back with the appointment time and date when they call us back.

## 2016-10-07 NOTE — Telephone Encounter (Signed)
Pt still has Rhinorocket in place. She spoke to Dr. Janace Hoard and will see him in the office on 10/10/16.  He did not start her on an antibiotic. She has nasal discharge that is clear. No fevers or chills.

## 2016-10-10 DIAGNOSIS — R040 Epistaxis: Secondary | ICD-10-CM | POA: Diagnosis not present

## 2016-10-13 DIAGNOSIS — Z1231 Encounter for screening mammogram for malignant neoplasm of breast: Secondary | ICD-10-CM | POA: Diagnosis not present

## 2016-10-25 DIAGNOSIS — L570 Actinic keratosis: Secondary | ICD-10-CM | POA: Diagnosis not present

## 2016-10-25 DIAGNOSIS — Z85828 Personal history of other malignant neoplasm of skin: Secondary | ICD-10-CM | POA: Diagnosis not present

## 2016-10-25 DIAGNOSIS — L0101 Non-bullous impetigo: Secondary | ICD-10-CM | POA: Diagnosis not present

## 2016-11-04 DIAGNOSIS — Z961 Presence of intraocular lens: Secondary | ICD-10-CM | POA: Diagnosis not present

## 2016-11-04 DIAGNOSIS — H524 Presbyopia: Secondary | ICD-10-CM | POA: Diagnosis not present

## 2016-12-29 DIAGNOSIS — L988 Other specified disorders of the skin and subcutaneous tissue: Secondary | ICD-10-CM | POA: Diagnosis not present

## 2016-12-29 DIAGNOSIS — L570 Actinic keratosis: Secondary | ICD-10-CM | POA: Diagnosis not present

## 2016-12-29 DIAGNOSIS — C44629 Squamous cell carcinoma of skin of left upper limb, including shoulder: Secondary | ICD-10-CM | POA: Diagnosis not present

## 2017-06-22 DIAGNOSIS — J209 Acute bronchitis, unspecified: Secondary | ICD-10-CM | POA: Diagnosis not present

## 2017-06-22 DIAGNOSIS — Z1389 Encounter for screening for other disorder: Secondary | ICD-10-CM | POA: Diagnosis not present

## 2017-06-22 DIAGNOSIS — Z6835 Body mass index (BMI) 35.0-35.9, adult: Secondary | ICD-10-CM | POA: Diagnosis not present

## 2017-07-05 DIAGNOSIS — L509 Urticaria, unspecified: Secondary | ICD-10-CM | POA: Diagnosis not present

## 2017-07-19 DIAGNOSIS — R062 Wheezing: Secondary | ICD-10-CM | POA: Diagnosis not present

## 2017-07-19 DIAGNOSIS — J45901 Unspecified asthma with (acute) exacerbation: Secondary | ICD-10-CM | POA: Diagnosis not present

## 2017-07-19 DIAGNOSIS — R05 Cough: Secondary | ICD-10-CM | POA: Diagnosis not present

## 2017-07-19 DIAGNOSIS — Z6835 Body mass index (BMI) 35.0-35.9, adult: Secondary | ICD-10-CM | POA: Diagnosis not present

## 2017-07-20 ENCOUNTER — Ambulatory Visit (INDEPENDENT_AMBULATORY_CARE_PROVIDER_SITE_OTHER): Payer: Medicare Other | Admitting: Pulmonary Disease

## 2017-07-20 ENCOUNTER — Encounter: Payer: Self-pay | Admitting: Pulmonary Disease

## 2017-07-20 DIAGNOSIS — J45909 Unspecified asthma, uncomplicated: Secondary | ICD-10-CM | POA: Insufficient documentation

## 2017-07-20 DIAGNOSIS — J4540 Moderate persistent asthma, uncomplicated: Secondary | ICD-10-CM

## 2017-07-20 MED ORDER — BUDESONIDE-FORMOTEROL FUMARATE 80-4.5 MCG/ACT IN AERO: 2.0000 | INHALATION_SPRAY | Freq: Two times a day (BID) | RESPIRATORY_TRACT | 0 refills | Status: DC

## 2017-07-20 NOTE — Patient Instructions (Signed)
Stop taking Asmanex. Trial of Symbicort 80/4.5- 2 puffs twice daily, rinse mouth after use.  Okay to take pro-air as needed for shortness of breath or wheezing Complete course of prednisone  Schedule spirometry pre-and post- on next Visit

## 2017-07-20 NOTE — Addendum Note (Signed)
Addended by: Benson Setting L on: 07/20/2017 12:21 PM   Modules accepted: Orders

## 2017-07-20 NOTE — Progress Notes (Signed)
Subjective:    Patient ID: Jessica Esparza, female    DOB: [birth year], 76 y.o.   MRN: [id number]  HPI  76 year old remote smoker presents for evaluation of recurrent attacks of asthmatic bronchitis. She reports onset in her late 75s, denies history of asthma, her maintenance regimen is been Asmanex and provider to be used on a when necessary basis. In between attacks she remains quite well. She had a chest cold 02/2017 and was improved with Z-Pak and steroids. In 6/18 her husband was sick with URI and she likely contracted the same from a.m. with increased shortness of breath and wheezing. Chest x-ray 6/28 was reported normal but PCP felt that she may have a right lower lobe pneumonia and was treated with cefdinir and steroids with improvement again however she developed hives with this and is listed allergic to cephalosporins, again improved with prednisone.  She carries a diagnosis of possible sarcoidosis and 76 when she was admitted to [facility] with hypercalcemia and increased vitamin D levels and renal insufficiency. However she never had a biopsy and was never told of any kind of lung involvement. She also has celiac disease and gout. She does have severe GERD and osteoporosis  She has a dog for 58 years Husband is a retired Pharmacist, community She quit smoking in 1980-about 15 pack years  Family history - mother, never smoker and died of lung cancer, sister and grandmother and asthma. Son has a fungal infection in his lungs   Past Medical History:  Diagnosis Date  . Asthma   . Celiac disease   . Osteoporosis   . Thyroid disease    Past Surgical History:  Procedure Laterality Date  . ABDOMINAL HYSTERECTOMY    . CHOLECYSTECTOMY    . SINUS EXPLORATION       Allergies  Allergen Reactions  . Nsaids Shortness Of Breath    Wheezing  . Cefdinir     Reported on June 2018  . Penicillins Hives    Social History   Social History  . Marital status: Married    Spouse name: N/A  . Number of  children: N/A  . Years of education: N/A   Occupational History  . Not on file.   Social History Main Topics  . Smoking status: Former Smoker    Years: 20.00    Types: Cigarettes  . Smokeless tobacco: Never Used  . Alcohol use Not on file  . Drug use: Unknown  . Sexual activity: Not on file   Other Topics Concern  . Not on file   Social History Narrative  . No narrative on file     No family history on file.  Review of Systems   Positive for shortness of breath with activity, nonproductive cough, acid heartburn and indigestion, tooth problems Constitutional: negative for anorexia, fevers and sweats  Eyes: negative for irritation, redness and visual disturbance  Ears, nose, mouth, throat, and face: negative for earaches, epistaxis, nasal congestion and sore throat  Respiratory: negative for cough,sputum  Cardiovascular: negative for chest pain, dyspnea, lower extremity edema, orthopnea, palpitations and syncope  Gastrointestinal: negative for abdominal pain, constipation, diarrhea, melena, nausea and vomiting  Genitourinary:negative for dysuria, frequency and hematuria  Hematologic/lymphatic: negative for bleeding, easy bruising and lymphadenopathy  Musculoskeletal:negative for arthralgias, muscle weakness and stiff joints  Neurological: negative for coordination problems, gait problems, headaches and weakness  Endocrine: negative for diabetic symptoms including polydipsia, polyuria and weight loss     Objective:   Physical Exam  Gen. Pleasant, obese, in no distress, normal affect ENT - no lesions, no post nasal drip, class 2-3 airway Neck: No JVD, no thyromegaly, no carotid bruits Lungs: no use of accessory muscles, no dullness to percussion, decreased without rales or rhonchi  Cardiovascular: Rhythm regular, heart sounds  normal, no murmurs or gallops, no peripheral edema Abdomen: soft and non-tender, no hepatosplenomegaly, BS normal. Musculoskeletal: No  deformities, no cyanosis or clubbing Neuro:  alert, non focal, no tremors        Assessment & Plan:

## 2017-07-20 NOTE — Assessment & Plan Note (Signed)
We'll stepup therapy to steroids/LABA combination Her history of possible sarcoidosis is interesting, given normal chest x-ray we'll not pursue this at this time but if no improvement can consider more detailed imaging  Stop taking Asmanex. Trial of Symbicort 80/4.5- 2 puffs twice daily, rinse mouth after use.  Okay to take pro-air as needed for shortness of breath or wheezing Complete course of prednisone  Schedule spirometry pre-and post- on next Visit

## 2017-07-20 NOTE — Addendum Note (Signed)
Addended by: Benson Setting L on: 07/20/2017 03:14 PM   Modules accepted: Orders

## 2017-08-24 DIAGNOSIS — M810 Age-related osteoporosis without current pathological fracture: Secondary | ICD-10-CM | POA: Diagnosis not present

## 2017-08-24 DIAGNOSIS — E784 Other hyperlipidemia: Secondary | ICD-10-CM | POA: Diagnosis not present

## 2017-08-24 DIAGNOSIS — E038 Other specified hypothyroidism: Secondary | ICD-10-CM | POA: Diagnosis not present

## 2017-08-24 DIAGNOSIS — M109 Gout, unspecified: Secondary | ICD-10-CM | POA: Diagnosis not present

## 2017-08-31 DIAGNOSIS — N8189 Other female genital prolapse: Secondary | ICD-10-CM | POA: Diagnosis not present

## 2017-08-31 DIAGNOSIS — J45901 Unspecified asthma with (acute) exacerbation: Secondary | ICD-10-CM | POA: Diagnosis not present

## 2017-08-31 DIAGNOSIS — R011 Cardiac murmur, unspecified: Secondary | ICD-10-CM | POA: Diagnosis not present

## 2017-08-31 DIAGNOSIS — M810 Age-related osteoporosis without current pathological fracture: Secondary | ICD-10-CM | POA: Diagnosis not present

## 2017-08-31 DIAGNOSIS — Z0000 Encounter for general adult medical examination without abnormal findings: Secondary | ICD-10-CM | POA: Diagnosis not present

## 2017-08-31 DIAGNOSIS — N183 Chronic kidney disease, stage 3 (moderate): Secondary | ICD-10-CM | POA: Diagnosis not present

## 2017-08-31 DIAGNOSIS — E784 Other hyperlipidemia: Secondary | ICD-10-CM | POA: Diagnosis not present

## 2017-08-31 DIAGNOSIS — Z6835 Body mass index (BMI) 35.0-35.9, adult: Secondary | ICD-10-CM | POA: Diagnosis not present

## 2017-08-31 DIAGNOSIS — K5730 Diverticulosis of large intestine without perforation or abscess without bleeding: Secondary | ICD-10-CM | POA: Diagnosis not present

## 2017-08-31 DIAGNOSIS — K900 Celiac disease: Secondary | ICD-10-CM | POA: Diagnosis not present

## 2017-08-31 DIAGNOSIS — Z23 Encounter for immunization: Secondary | ICD-10-CM | POA: Diagnosis not present

## 2017-08-31 DIAGNOSIS — M109 Gout, unspecified: Secondary | ICD-10-CM | POA: Diagnosis not present

## 2017-09-04 ENCOUNTER — Encounter: Payer: Self-pay | Admitting: Adult Health

## 2017-09-04 ENCOUNTER — Ambulatory Visit (INDEPENDENT_AMBULATORY_CARE_PROVIDER_SITE_OTHER): Payer: Medicare Other | Admitting: Pulmonary Disease

## 2017-09-04 ENCOUNTER — Ambulatory Visit (INDEPENDENT_AMBULATORY_CARE_PROVIDER_SITE_OTHER): Payer: Medicare Other | Admitting: Adult Health

## 2017-09-04 DIAGNOSIS — J4540 Moderate persistent asthma, uncomplicated: Secondary | ICD-10-CM | POA: Diagnosis not present

## 2017-09-04 LAB — PULMONARY FUNCTION TEST
FEF 25-75 POST: 1.79 L/s
FEF 25-75 PRE: 2.03 L/s
FEF2575-%CHANGE-POST: -11 %
FEF2575-%PRED-PRE: 117 %
FEF2575-%Pred-Post: 103 %
FEV1-%Change-Post: -3 %
FEV1-%PRED-POST: 85 %
FEV1-%PRED-PRE: 89 %
FEV1-Post: 1.91 L
FEV1-Pre: 1.99 L
FEV1FVC-%CHANGE-POST: 1 %
FEV1FVC-%PRED-PRE: 110 %
FEV6-%CHANGE-POST: -5 %
FEV6-%Pred-Post: 80 %
FEV6-%Pred-Pre: 85 %
FEV6-Post: 2.28 L
FEV6-Pre: 2.41 L
FEV6FVC-%Pred-Post: 105 %
FEV6FVC-%Pred-Pre: 105 %
FVC-%Change-Post: -5 %
FVC-%Pred-Post: 76 %
FVC-%Pred-Pre: 81 %
FVC-POST: 2.28 L
FVC-Pre: 2.41 L
POST FEV1/FVC RATIO: 84 %
PRE FEV1/FVC RATIO: 83 %
Post FEV6/FVC ratio: 100 %
Pre FEV6/FVC Ratio: 100 %

## 2017-09-04 NOTE — Progress Notes (Signed)
Spirometry pre and post done today. 

## 2017-09-04 NOTE — Patient Instructions (Addendum)
Continue on Asmanex 2 puffs daily , rinse after use.  Use ProAir 2 puffs every 4hr as needed -this is your rescue inhaler.  Follow up with Dr. Elsworth Soho  In 6 months and As needed

## 2017-09-04 NOTE — Assessment & Plan Note (Signed)
Recent flare now resolved  Doing well on Asmanex  Spirometry reviewed w/ normal lung fxn.   Plan  Patient Instructions  Continue on Asmanex 2 puffs daily , rinse after use.  Use ProAir 2 puffs every 4hr as needed -this is your rescue inhaler.  Follow up with Dr. Elsworth Soho  In 6 months and As needed

## 2017-09-04 NOTE — Progress Notes (Signed)
@Patient  ID: Jessica Esparza, female    DOB: [birth year], 76 y.o.   MRN: [id number]  Chief Complaint  Patient presents with  . Follow-up    Asthma    Referring provider: Crist Infante, MD  HPI: 76 yo female former smoker seen for pulmonary consult 07/20/17 for asthma .   09/04/2017 Follow up : Asthma  Pt returns for 2 month follow up . She was seen last ov for slow to resolve Asthma exacerbation after dx with Bronchitis in June . She was changed from Asmanex to Symbicort .  She was treated with steroid pack prior to consult . She says she is much better with resolution of wheezing and cough . She did not like the Symbicort felt it made her jittery and has changed back to Asmanex. Says she is doing well. Did notice that with the rain today she felt some drainage and tightness.   Pre/Post Spirometry showed normal lung function with FEV1 89%, ratio 83, FVC 81%. No sign. BD response   Allergies  Allergen Reactions  . Nsaids Shortness Of Breath    Wheezing  . Cefdinir     Reported on June 2018  . Penicillins Hives    Immunization History  Administered Date(s) Administered  . Influenza, High Dose Seasonal PF 08/28/2017  . Influenza-Unspecified 09/16/2016    Past Medical History:  Diagnosis Date  . Asthma   . Celiac disease   . Osteoporosis   . Thyroid disease     Tobacco History: History  Smoking Status  . Former Smoker  . Packs/day: 1.00  . Years: 20.00  . Types: Cigarettes  . Quit date: 12/27/1979  Smokeless Tobacco  . Never Used   Counseling given: Not Answered   Outpatient Encounter Prescriptions as of 09/04/2017  Medication Sig  . albuterol (PROVENTIL HFA;VENTOLIN HFA) 108 (90 Base) MCG/ACT inhaler Inhale 2 puffs into the lungs every 6 (six) hours as needed.   Marland Kitchen allopurinol (ZYLOPRIM) 100 MG tablet Take 100 mg by mouth daily.  . cetirizine (ZYRTEC) 10 MG tablet Take 10 mg by mouth.  . colchicine 0.6 MG tablet Take 0.6 mg by mouth daily as needed.   .  famotidine (PEPCID) 20 MG tablet Take 20 mg by mouth.  . Fluticasone Propionate (FLONASE NA) Place into the nose 2 (two) times daily.  . Levothyroxine Sodium (SYNTHROID PO) Take 150 mcg by mouth daily.   . mometasone (ASMANEX 60 METERED DOSES) 220 MCG/INH inhaler Inhale 2 puffs into the lungs daily.  . simvastatin (ZOCOR) 20 MG tablet Take 20 mg by mouth daily.  . sucralfate (CARAFATE) 1 g tablet Take 1 g by mouth 4 (four) times daily -  with meals and at bedtime.  . [DISCONTINUED] Mometasone Furoate (ASMANEX HFA IN) Inhale into the lungs daily.  . [DISCONTINUED] budesonide-formoterol (SYMBICORT) 80-4.5 MCG/ACT inhaler Inhale 2 puffs into the lungs 2 (two) times daily. (Patient not taking: Reported on 09/04/2017)  . [DISCONTINUED] HYDROcodone-acetaminophen (NORCO/VICODIN) 5-325 MG tablet Take 1-2 tablets by mouth every 6 (six) hours as needed. (Patient not taking: Reported on 09/04/2017)   No facility-administered encounter medications on file as of 09/04/2017.      Review of Systems  Constitutional:   No  weight loss, night sweats,  Fevers, chills, fatigue, or  lassitude.  HEENT:   No headaches,  Difficulty swallowing,  Tooth/dental problems, or  Sore throat,                No sneezing, itching, ear ache, nasal  congestion, post nasal drip,   CV:  No chest pain,  Orthopnea, PND, swelling in lower extremities, anasarca, dizziness, palpitations, syncope.   GI  No heartburn, indigestion, abdominal pain, nausea, vomiting, diarrhea, change in bowel habits, loss of appetite, bloody stools.   Resp: No shortness of breath with exertion or at rest.  No excess mucus, no productive cough,  No non-productive cough,  No coughing up of blood.  No change in color of mucus.  No wheezing.  No chest wall deformity  Skin: no rash or lesions.  GU: no dysuria, change in color of urine, no urgency or frequency.  No flank pain, no hematuria   MS:  No joint pain or swelling.  No decreased range of motion.  No  back pain.    Physical Exam  BP 120/72 (BP Location: Left Arm, Cuff Size: Large)   Pulse 80   Ht 5' 5.25" (1.657 m)   Wt 225 lb (102.1 kg)   SpO2 96%   BMI 37.16 kg/m   GEN: A/Ox3; pleasant , NAD, well nourished    HEENT:  [place]/AT,  EACs-clear, TMs-wnl, NOSE-clear, THROAT-clear, no lesions, no postnasal drip or exudate noted.   NECK:  Supple w/ fair ROM; no JVD; normal carotid impulses w/o bruits; no thyromegaly or nodules palpated; no lymphadenopathy.    RESP  Clear  P & A; w/o, wheezes/ rales/ or rhonchi. no accessory muscle use, no dullness to percussion  CARD:  RRR, no m/r/g, no peripheral edema, pulses intact, no cyanosis or clubbing.  GI:   Soft & nt; nml bowel sounds; no organomegaly or masses detected.   Musco: Warm bil, no deformities or joint swelling noted.   Neuro: alert, no focal deficits noted.    Skin: Warm, no lesions or rashes    Lab Results:  CBC   BNP No results found for: BNP  ProBNP No results found for: PROBNP  Imaging: No results found.   Assessment & Plan:   Asthmatic bronchitis Recent flare now resolved  Doing well on Asmanex  Spirometry reviewed w/ normal lung fxn.   Plan  Patient Instructions  Continue on Asmanex 2 puffs daily , rinse after use.  Use ProAir 2 puffs every 4hr as needed -this is your rescue inhaler.  Follow up with Dr. Elsworth Soho  In 6 months and As needed         Rexene Edison, NP 09/04/2017

## 2017-09-08 DIAGNOSIS — Z1212 Encounter for screening for malignant neoplasm of rectum: Secondary | ICD-10-CM | POA: Diagnosis not present

## 2017-10-13 DIAGNOSIS — M722 Plantar fascial fibromatosis: Secondary | ICD-10-CM | POA: Diagnosis not present

## 2017-10-13 DIAGNOSIS — G8929 Other chronic pain: Secondary | ICD-10-CM | POA: Diagnosis not present

## 2017-10-13 DIAGNOSIS — M79671 Pain in right foot: Secondary | ICD-10-CM | POA: Diagnosis not present

## 2017-10-17 DIAGNOSIS — Z1231 Encounter for screening mammogram for malignant neoplasm of breast: Secondary | ICD-10-CM | POA: Diagnosis not present

## 2017-11-07 DIAGNOSIS — M810 Age-related osteoporosis without current pathological fracture: Secondary | ICD-10-CM | POA: Diagnosis not present

## 2017-11-07 DIAGNOSIS — E038 Other specified hypothyroidism: Secondary | ICD-10-CM | POA: Diagnosis not present

## 2018-01-02 DIAGNOSIS — N183 Chronic kidney disease, stage 3 (moderate): Secondary | ICD-10-CM | POA: Diagnosis not present

## 2018-01-02 DIAGNOSIS — E039 Hypothyroidism, unspecified: Secondary | ICD-10-CM | POA: Diagnosis not present

## 2018-01-02 DIAGNOSIS — Z0000 Encounter for general adult medical examination without abnormal findings: Secondary | ICD-10-CM | POA: Diagnosis not present

## 2018-01-02 DIAGNOSIS — E038 Other specified hypothyroidism: Secondary | ICD-10-CM | POA: Diagnosis not present

## 2018-02-25 ENCOUNTER — Other Ambulatory Visit: Payer: Self-pay

## 2018-02-25 ENCOUNTER — Emergency Department (HOSPITAL_COMMUNITY): Payer: Medicare Other

## 2018-02-25 ENCOUNTER — Encounter (HOSPITAL_COMMUNITY): Payer: Self-pay

## 2018-02-25 ENCOUNTER — Emergency Department (HOSPITAL_COMMUNITY)
Admission: EM | Admit: 2018-02-25 | Discharge: 2018-02-25 | Disposition: A | Discharge status: Home / Self Care | Payer: Medicare Other | Attending: Emergency Medicine | Admitting: Emergency Medicine

## 2018-02-25 DIAGNOSIS — Z79899 Other long term (current) drug therapy: Secondary | ICD-10-CM | POA: Diagnosis not present

## 2018-02-25 DIAGNOSIS — S299XXA Unspecified injury of thorax, initial encounter: Secondary | ICD-10-CM | POA: Diagnosis not present

## 2018-02-25 DIAGNOSIS — J45909 Unspecified asthma, uncomplicated: Secondary | ICD-10-CM | POA: Diagnosis not present

## 2018-02-25 DIAGNOSIS — Y929 Unspecified place or not applicable: Secondary | ICD-10-CM | POA: Diagnosis not present

## 2018-02-25 DIAGNOSIS — W19XXXA Unspecified fall, initial encounter: Secondary | ICD-10-CM | POA: Insufficient documentation

## 2018-02-25 DIAGNOSIS — R0789 Other chest pain: Secondary | ICD-10-CM | POA: Diagnosis not present

## 2018-02-25 DIAGNOSIS — Z87891 Personal history of nicotine dependence: Secondary | ICD-10-CM | POA: Diagnosis not present

## 2018-02-25 DIAGNOSIS — R52 Pain, unspecified: Secondary | ICD-10-CM | POA: Diagnosis not present

## 2018-02-25 DIAGNOSIS — Y999 Unspecified external cause status: Secondary | ICD-10-CM | POA: Insufficient documentation

## 2018-02-25 DIAGNOSIS — Y939 Activity, unspecified: Secondary | ICD-10-CM | POA: Diagnosis not present

## 2018-02-25 DIAGNOSIS — R079 Chest pain, unspecified: Secondary | ICD-10-CM | POA: Diagnosis present

## 2018-02-25 DIAGNOSIS — R0781 Pleurodynia: Secondary | ICD-10-CM | POA: Diagnosis not present

## 2018-02-25 MED ORDER — HYDROCODONE-ACETAMINOPHEN 5-325 MG PO TABS: 1.0000 | ORAL_TABLET | Freq: Four times a day (QID) | ORAL | 0 refills | Status: AC | PRN

## 2018-02-25 MED ORDER — LIDOCAINE 5 % EX PTCH: 1.0000 | MEDICATED_PATCH | CUTANEOUS | 0 refills | Status: AC

## 2018-02-25 MED ORDER — LIDOCAINE 5 % EX PTCH
1.0000 | MEDICATED_PATCH | CUTANEOUS | Status: DC
  Administered 2018-02-25: 1 via TRANSDERMAL
  Filled 2018-02-25: qty 1

## 2018-02-25 NOTE — ED Notes (Signed)
Bed: WA21 Expected date:  Expected time:  Means of arrival:  Comments: 77 f rib pain

## 2018-02-25 NOTE — ED Triage Notes (Signed)
Fell on Wednesday and now right rib pain has gotten worse hurts to breathe, able to speak in full sentences.

## 2018-02-25 NOTE — Discharge Instructions (Signed)
As discussed, your evaluation today has been largely reassuring.  But, it is important that you monitor your condition carefully, and do not hesitate to return to the ED if you develop new, or concerning changes in your condition.  Please take all medication as directed, and be sure to use the incentive spirometer.  Otherwise, please follow-up with your physician for appropriate ongoing care.

## 2018-02-25 NOTE — ED Provider Notes (Signed)
[facility] DEPT Provider Note   CSN: [id number] Arrival date & time: 02/25/18  0457     History   Chief Complaint Chief Complaint  Patient presents with  . Rib Injury    HPI Jessica Esparza is a 77 y.o. female.  HPI Presents with concern of right chest wall pain. Patient recalls a fall 4 days ago. Initially, the patient's pain in this area was mild, but over the past 2 days the pain is become severe, sharp, not controlled with Tylenol.  Limb pain is worse with inspiration, and prevents her from sleeping. Pain is nonradiating, focally about the inframammary area. No other chest pain, no syncope, no fever, chills.  Past Medical History:  Diagnosis Date  . Asthma   . Celiac disease   . Osteoporosis   . Thyroid disease     Patient Active Problem List   Diagnosis Date Noted  . Asthmatic bronchitis 07/20/2017    Past Surgical History:  Procedure Laterality Date  . ABDOMINAL HYSTERECTOMY    . CHOLECYSTECTOMY    . SINUS EXPLORATION      OB History    No data available       Home Medications    Prior to Admission medications   Medication Sig Start Date End Date Taking? Authorizing Provider  albuterol (PROVENTIL HFA;VENTOLIN HFA) 108 (90 Base) MCG/ACT inhaler Inhale 2 puffs into the lungs every 6 (six) hours as needed.     [provider]  allopurinol (ZYLOPRIM) 100 MG tablet Take 100 mg by mouth daily.    [provider]  cetirizine (ZYRTEC) 10 MG tablet Take 10 mg by mouth.    [provider]  colchicine 0.6 MG tablet Take 0.6 mg by mouth daily as needed.     [provider]  famotidine (PEPCID) 20 MG tablet Take 20 mg by mouth.    [provider]  Fluticasone Propionate (FLONASE NA) Place into the nose 2 (two) times daily.    [provider]  Levothyroxine Sodium (SYNTHROID PO) Take 150 mcg by mouth daily.     [provider]  mometasone (ASMANEX 60 METERED DOSES) 220  MCG/INH inhaler Inhale 2 puffs into the lungs daily.    [provider]  simvastatin (ZOCOR) 20 MG tablet Take 20 mg by mouth daily.    [provider]  sucralfate (CARAFATE) 1 g tablet Take 1 g by mouth 4 (four) times daily -  with meals and at bedtime.    [provider]    Family History History reviewed. No pertinent family history.  Social History Social History   Tobacco Use  . Smoking status: Former Smoker    Packs/day: 1.00    Years: 20.00    Pack years: 20.00    Types: Cigarettes    Last attempt to quit: 12/27/1979    Years since quitting: 38.1  . Smokeless tobacco: Never Used  Substance Use Topics  . Alcohol use: Not on file  . Drug use: Not on file     Allergies   Nsaids; Cefdinir; and Penicillins   Review of Systems Review of Systems  Constitutional:       Per HPI, otherwise negative  HENT:       Per HPI, otherwise negative  Respiratory:       Per HPI, otherwise negative  Cardiovascular:       Per HPI, otherwise negative  Gastrointestinal: Negative for vomiting.  Endocrine:       Negative  aside from HPI  Genitourinary:       Neg aside from HPI   Musculoskeletal:       Per HPI, otherwise negative  Skin: Negative.   Neurological: Negative for syncope.     Physical Exam Updated Vital Signs BP (!) 145/81 (BP Location: Left Arm)   Pulse 79   Temp 97.9 F (36.6 C) (Oral)   Resp 12   Ht 5\' 5"  (1.651 m)   Wt 102.1 kg (225 lb)   SpO2 99%   BMI 37.44 kg/m   Physical Exam  Constitutional: She is oriented to person, place, and time. She appears well-developed and well-nourished. No distress.  HENT:  Head: Normocephalic and atraumatic.  Eyes: Conjunctivae and EOM are normal.  Cardiovascular: Normal rate and regular rhythm.  Pulmonary/Chest: Effort normal and breath sounds normal. No stridor. No respiratory distress. She exhibits tenderness.  Abdominal: She exhibits no distension.  Musculoskeletal: She exhibits no edema.    Neurological: She is alert and oriented to person, place, and time. No cranial nerve deficit.  Skin: Skin is warm and dry.  Psychiatric: She has a normal mood and affect.  Nursing note and vitals reviewed.    ED Treatments / Results  Labs (all labs ordered are listed, but only abnormal results are displayed) Labs Reviewed - No data to display  EKG  EKG Interpretation  Date/Time:  Sunday February 25 2018 05:14:31 EST Ventricular Rate:  84 PR Interval:    QRS Duration: 96 QT Interval:  377 QTC Calculation: 446 R Axis:   60 Text Interpretation:  Sinus rhythm T wave abnormality Artifact Abnormal ekg Confirmed by Carmin Muskrat [phone]) on 02/25/2018 5:27:32 AM       Radiology Dg Ribs Unilateral W/chest Right  Result Date: 02/25/2018 CLINICAL DATA:  77 y/o F; patient fell 4 days ago. Right lower anterior rib pain. EXAM: RIGHT RIBS AND CHEST - 3+ VIEW COMPARISON:  None. FINDINGS: No fracture or other bone lesions are seen involving the ribs. There is no evidence of pneumothorax or pleural effusion. Patchy perihilar opacities in the lungs. Heart size and mediastinal contours are within normal limits. Right upper quadrant cholecystectomy clips. Mild thoracic dextrocurvature. IMPRESSION: 1. No displaced acute rib fracture identified. 2. Patchy perihilar opacities in the lungs, possibly mild edema or bronchitic changes. Electronically Signed   By: Kristine Garbe M.D.   On: 02/25/2018 05:55    Procedures Procedures (including critical care time)  Medications Ordered in ED Medications  lidocaine (LIDODERM) 5 % 1 patch (1 patch Transdermal Patch Applied 02/25/18 0614)     Initial Impression / Assessment and Plan / ED Course  I have reviewed the triage vital signs and the nursing notes.  Pertinent labs & imaging results that were available during my care of the patient were reviewed by me and considered in my medical decision making (see chart for details).     6:23 AM Patient  awake alert, in no distress. I discussed x-ray findings, possibility of occult fracture, but reassuring absence of evidence for pneumothorax, obvious pneumonia Patient is afebrile We discussed additional options for analgesia including Lidoderm patch, low-dose narcotic, and the importance of incentive spirometry. With these considerations, and the aforementioned reassuring x-ray, absence of vital sign abnormalities, the patient was discharged in stable condition.  Final Clinical Impressions(s) / ED Diagnoses  Chest wall pain   Carmin Muskrat, MD 02/25/18 [phone]

## 2018-06-06 DIAGNOSIS — E038 Other specified hypothyroidism: Secondary | ICD-10-CM | POA: Diagnosis not present

## 2018-06-06 DIAGNOSIS — N183 Chronic kidney disease, stage 3 (moderate): Secondary | ICD-10-CM | POA: Diagnosis not present

## 2018-06-06 DIAGNOSIS — M791 Myalgia, unspecified site: Secondary | ICD-10-CM | POA: Diagnosis not present

## 2018-06-06 DIAGNOSIS — R5383 Other fatigue: Secondary | ICD-10-CM | POA: Diagnosis not present

## 2018-06-06 DIAGNOSIS — Z1321 Encounter for screening for nutritional disorder: Secondary | ICD-10-CM | POA: Diagnosis not present

## 2018-06-06 DIAGNOSIS — Z6836 Body mass index (BMI) 36.0-36.9, adult: Secondary | ICD-10-CM | POA: Diagnosis not present

## 2018-07-30 DIAGNOSIS — Z961 Presence of intraocular lens: Secondary | ICD-10-CM | POA: Diagnosis not present

## 2018-07-30 DIAGNOSIS — H52203 Unspecified astigmatism, bilateral: Secondary | ICD-10-CM | POA: Diagnosis not present

## 2018-07-30 DIAGNOSIS — H04123 Dry eye syndrome of bilateral lacrimal glands: Secondary | ICD-10-CM | POA: Diagnosis not present

## 2018-08-29 DIAGNOSIS — N8111 Cystocele, midline: Secondary | ICD-10-CM | POA: Diagnosis not present

## 2018-08-29 DIAGNOSIS — L280 Lichen simplex chronicus: Secondary | ICD-10-CM | POA: Diagnosis not present

## 2018-08-29 DIAGNOSIS — B359 Dermatophytosis, unspecified: Secondary | ICD-10-CM | POA: Diagnosis not present

## 2018-08-29 DIAGNOSIS — Z124 Encounter for screening for malignant neoplasm of cervix: Secondary | ICD-10-CM | POA: Diagnosis not present

## 2018-08-29 DIAGNOSIS — N952 Postmenopausal atrophic vaginitis: Secondary | ICD-10-CM | POA: Diagnosis not present

## 2018-09-18 ENCOUNTER — Telehealth (HOSPITAL_COMMUNITY): Payer: Self-pay | Admitting: Surgery

## 2018-09-18 DIAGNOSIS — Z23 Encounter for immunization: Secondary | ICD-10-CM | POA: Diagnosis not present

## 2018-09-18 DIAGNOSIS — M67431 Ganglion, right wrist: Secondary | ICD-10-CM | POA: Diagnosis not present

## 2018-09-18 DIAGNOSIS — I708 Atherosclerosis of other arteries: Secondary | ICD-10-CM | POA: Diagnosis not present

## 2018-09-18 DIAGNOSIS — N183 Chronic kidney disease, stage 3 (moderate): Secondary | ICD-10-CM | POA: Diagnosis not present

## 2018-09-18 DIAGNOSIS — M5010 Cervical disc disorder with radiculopathy, unspecified cervical region: Secondary | ICD-10-CM | POA: Diagnosis not present

## 2018-09-18 DIAGNOSIS — Z6836 Body mass index (BMI) 36.0-36.9, adult: Secondary | ICD-10-CM | POA: Diagnosis not present

## 2018-09-18 DIAGNOSIS — R202 Paresthesia of skin: Secondary | ICD-10-CM | POA: Diagnosis not present

## 2018-09-18 NOTE — Telephone Encounter (Signed)
Received an order from Dr. Crist Infante requesting carotid ultrasound for paresthesia and carotid atherosclerosis.  A voicemail was left at [phone] asking pt to call Rip Harbour at [facility] to arrange an appointment.

## 2018-09-20 ENCOUNTER — Other Ambulatory Visit (HOSPITAL_COMMUNITY): Payer: Self-pay | Admitting: Internal Medicine

## 2018-09-20 DIAGNOSIS — R202 Paresthesia of skin: Secondary | ICD-10-CM

## 2018-09-21 ENCOUNTER — Ambulatory Visit (HOSPITAL_COMMUNITY)
Admission: RE | Admit: 2018-09-21 | Discharge: 2018-09-21 | Disposition: A | Discharge status: Home / Self Care | Payer: Medicare Other | Source: Ambulatory Visit | Attending: Family | Admitting: Family

## 2018-09-21 DIAGNOSIS — R202 Paresthesia of skin: Secondary | ICD-10-CM | POA: Diagnosis not present

## 2018-09-21 DIAGNOSIS — I6523 Occlusion and stenosis of bilateral carotid arteries: Secondary | ICD-10-CM | POA: Diagnosis not present

## 2018-09-24 DIAGNOSIS — M4802 Spinal stenosis, cervical region: Secondary | ICD-10-CM | POA: Diagnosis not present

## 2018-10-02 DIAGNOSIS — M810 Age-related osteoporosis without current pathological fracture: Secondary | ICD-10-CM | POA: Diagnosis not present

## 2018-10-02 DIAGNOSIS — R82998 Other abnormal findings in urine: Secondary | ICD-10-CM | POA: Diagnosis not present

## 2018-10-02 DIAGNOSIS — M109 Gout, unspecified: Secondary | ICD-10-CM | POA: Diagnosis not present

## 2018-10-02 DIAGNOSIS — N183 Chronic kidney disease, stage 3 (moderate): Secondary | ICD-10-CM | POA: Diagnosis not present

## 2018-10-02 DIAGNOSIS — E7849 Other hyperlipidemia: Secondary | ICD-10-CM | POA: Diagnosis not present

## 2018-10-02 DIAGNOSIS — E038 Other specified hypothyroidism: Secondary | ICD-10-CM | POA: Diagnosis not present

## 2018-10-04 DIAGNOSIS — M546 Pain in thoracic spine: Secondary | ICD-10-CM | POA: Diagnosis not present

## 2018-10-04 DIAGNOSIS — M542 Cervicalgia: Secondary | ICD-10-CM | POA: Diagnosis not present

## 2018-10-04 DIAGNOSIS — M9902 Segmental and somatic dysfunction of thoracic region: Secondary | ICD-10-CM | POA: Diagnosis not present

## 2018-10-04 DIAGNOSIS — M9901 Segmental and somatic dysfunction of cervical region: Secondary | ICD-10-CM | POA: Diagnosis not present

## 2018-10-08 DIAGNOSIS — M542 Cervicalgia: Secondary | ICD-10-CM | POA: Diagnosis not present

## 2018-10-08 DIAGNOSIS — M9902 Segmental and somatic dysfunction of thoracic region: Secondary | ICD-10-CM | POA: Diagnosis not present

## 2018-10-08 DIAGNOSIS — M546 Pain in thoracic spine: Secondary | ICD-10-CM | POA: Diagnosis not present

## 2018-10-08 DIAGNOSIS — M9901 Segmental and somatic dysfunction of cervical region: Secondary | ICD-10-CM | POA: Diagnosis not present

## 2018-10-09 DIAGNOSIS — R5383 Other fatigue: Secondary | ICD-10-CM | POA: Diagnosis not present

## 2018-10-09 DIAGNOSIS — M67431 Ganglion, right wrist: Secondary | ICD-10-CM | POA: Diagnosis not present

## 2018-10-09 DIAGNOSIS — J45901 Unspecified asthma with (acute) exacerbation: Secondary | ICD-10-CM | POA: Diagnosis not present

## 2018-10-09 DIAGNOSIS — Z1389 Encounter for screening for other disorder: Secondary | ICD-10-CM | POA: Diagnosis not present

## 2018-10-09 DIAGNOSIS — M9902 Segmental and somatic dysfunction of thoracic region: Secondary | ICD-10-CM | POA: Diagnosis not present

## 2018-10-09 DIAGNOSIS — I708 Atherosclerosis of other arteries: Secondary | ICD-10-CM | POA: Diagnosis not present

## 2018-10-09 DIAGNOSIS — M546 Pain in thoracic spine: Secondary | ICD-10-CM | POA: Diagnosis not present

## 2018-10-09 DIAGNOSIS — Z6836 Body mass index (BMI) 36.0-36.9, adult: Secondary | ICD-10-CM | POA: Diagnosis not present

## 2018-10-09 DIAGNOSIS — Z0000 Encounter for general adult medical examination without abnormal findings: Secondary | ICD-10-CM | POA: Diagnosis not present

## 2018-10-09 DIAGNOSIS — N183 Chronic kidney disease, stage 3 (moderate): Secondary | ICD-10-CM | POA: Diagnosis not present

## 2018-10-09 DIAGNOSIS — R202 Paresthesia of skin: Secondary | ICD-10-CM | POA: Diagnosis not present

## 2018-10-09 DIAGNOSIS — M542 Cervicalgia: Secondary | ICD-10-CM | POA: Diagnosis not present

## 2018-10-09 DIAGNOSIS — E7849 Other hyperlipidemia: Secondary | ICD-10-CM | POA: Diagnosis not present

## 2018-10-09 DIAGNOSIS — M9901 Segmental and somatic dysfunction of cervical region: Secondary | ICD-10-CM | POA: Diagnosis not present

## 2018-10-09 DIAGNOSIS — M810 Age-related osteoporosis without current pathological fracture: Secondary | ICD-10-CM | POA: Diagnosis not present

## 2018-10-09 DIAGNOSIS — M5010 Cervical disc disorder with radiculopathy, unspecified cervical region: Secondary | ICD-10-CM | POA: Diagnosis not present

## 2018-10-11 DIAGNOSIS — M542 Cervicalgia: Secondary | ICD-10-CM | POA: Diagnosis not present

## 2018-10-11 DIAGNOSIS — Z1212 Encounter for screening for malignant neoplasm of rectum: Secondary | ICD-10-CM | POA: Diagnosis not present

## 2018-10-11 DIAGNOSIS — M9902 Segmental and somatic dysfunction of thoracic region: Secondary | ICD-10-CM | POA: Diagnosis not present

## 2018-10-11 DIAGNOSIS — M9901 Segmental and somatic dysfunction of cervical region: Secondary | ICD-10-CM | POA: Diagnosis not present

## 2018-10-11 DIAGNOSIS — M546 Pain in thoracic spine: Secondary | ICD-10-CM | POA: Diagnosis not present

## 2018-10-15 DIAGNOSIS — M9902 Segmental and somatic dysfunction of thoracic region: Secondary | ICD-10-CM | POA: Diagnosis not present

## 2018-10-15 DIAGNOSIS — M546 Pain in thoracic spine: Secondary | ICD-10-CM | POA: Diagnosis not present

## 2018-10-15 DIAGNOSIS — M542 Cervicalgia: Secondary | ICD-10-CM | POA: Diagnosis not present

## 2018-10-15 DIAGNOSIS — M9901 Segmental and somatic dysfunction of cervical region: Secondary | ICD-10-CM | POA: Diagnosis not present

## 2018-10-16 DIAGNOSIS — M542 Cervicalgia: Secondary | ICD-10-CM | POA: Diagnosis not present

## 2018-10-16 DIAGNOSIS — M9902 Segmental and somatic dysfunction of thoracic region: Secondary | ICD-10-CM | POA: Diagnosis not present

## 2018-10-16 DIAGNOSIS — M9901 Segmental and somatic dysfunction of cervical region: Secondary | ICD-10-CM | POA: Diagnosis not present

## 2018-10-16 DIAGNOSIS — M546 Pain in thoracic spine: Secondary | ICD-10-CM | POA: Diagnosis not present

## 2018-10-18 DIAGNOSIS — M542 Cervicalgia: Secondary | ICD-10-CM | POA: Diagnosis not present

## 2018-10-18 DIAGNOSIS — M9901 Segmental and somatic dysfunction of cervical region: Secondary | ICD-10-CM | POA: Diagnosis not present

## 2018-10-18 DIAGNOSIS — M9902 Segmental and somatic dysfunction of thoracic region: Secondary | ICD-10-CM | POA: Diagnosis not present

## 2018-10-18 DIAGNOSIS — M546 Pain in thoracic spine: Secondary | ICD-10-CM | POA: Diagnosis not present

## 2018-10-22 DIAGNOSIS — M9902 Segmental and somatic dysfunction of thoracic region: Secondary | ICD-10-CM | POA: Diagnosis not present

## 2018-10-22 DIAGNOSIS — M542 Cervicalgia: Secondary | ICD-10-CM | POA: Diagnosis not present

## 2018-10-22 DIAGNOSIS — M9901 Segmental and somatic dysfunction of cervical region: Secondary | ICD-10-CM | POA: Diagnosis not present

## 2018-10-22 DIAGNOSIS — M546 Pain in thoracic spine: Secondary | ICD-10-CM | POA: Diagnosis not present

## 2018-10-23 DIAGNOSIS — M9901 Segmental and somatic dysfunction of cervical region: Secondary | ICD-10-CM | POA: Diagnosis not present

## 2018-10-23 DIAGNOSIS — Z1231 Encounter for screening mammogram for malignant neoplasm of breast: Secondary | ICD-10-CM | POA: Diagnosis not present

## 2018-10-23 DIAGNOSIS — M542 Cervicalgia: Secondary | ICD-10-CM | POA: Diagnosis not present

## 2018-10-23 DIAGNOSIS — M546 Pain in thoracic spine: Secondary | ICD-10-CM | POA: Diagnosis not present

## 2018-10-23 DIAGNOSIS — M9902 Segmental and somatic dysfunction of thoracic region: Secondary | ICD-10-CM | POA: Diagnosis not present

## 2018-10-25 DIAGNOSIS — M546 Pain in thoracic spine: Secondary | ICD-10-CM | POA: Diagnosis not present

## 2018-10-25 DIAGNOSIS — M9901 Segmental and somatic dysfunction of cervical region: Secondary | ICD-10-CM | POA: Diagnosis not present

## 2018-10-25 DIAGNOSIS — M542 Cervicalgia: Secondary | ICD-10-CM | POA: Diagnosis not present

## 2018-10-25 DIAGNOSIS — M9902 Segmental and somatic dysfunction of thoracic region: Secondary | ICD-10-CM | POA: Diagnosis not present

## 2018-10-29 DIAGNOSIS — M546 Pain in thoracic spine: Secondary | ICD-10-CM | POA: Diagnosis not present

## 2018-10-29 DIAGNOSIS — M9902 Segmental and somatic dysfunction of thoracic region: Secondary | ICD-10-CM | POA: Diagnosis not present

## 2018-10-29 DIAGNOSIS — M542 Cervicalgia: Secondary | ICD-10-CM | POA: Diagnosis not present

## 2018-10-29 DIAGNOSIS — M9901 Segmental and somatic dysfunction of cervical region: Secondary | ICD-10-CM | POA: Diagnosis not present

## 2018-10-31 DIAGNOSIS — M542 Cervicalgia: Secondary | ICD-10-CM | POA: Diagnosis not present

## 2018-10-31 DIAGNOSIS — M9901 Segmental and somatic dysfunction of cervical region: Secondary | ICD-10-CM | POA: Diagnosis not present

## 2018-10-31 DIAGNOSIS — M546 Pain in thoracic spine: Secondary | ICD-10-CM | POA: Diagnosis not present

## 2018-10-31 DIAGNOSIS — M9902 Segmental and somatic dysfunction of thoracic region: Secondary | ICD-10-CM | POA: Diagnosis not present

## 2018-11-05 DIAGNOSIS — M542 Cervicalgia: Secondary | ICD-10-CM | POA: Diagnosis not present

## 2018-11-05 DIAGNOSIS — M9901 Segmental and somatic dysfunction of cervical region: Secondary | ICD-10-CM | POA: Diagnosis not present

## 2018-11-05 DIAGNOSIS — M9902 Segmental and somatic dysfunction of thoracic region: Secondary | ICD-10-CM | POA: Diagnosis not present

## 2018-11-05 DIAGNOSIS — M546 Pain in thoracic spine: Secondary | ICD-10-CM | POA: Diagnosis not present

## 2018-11-07 DIAGNOSIS — M9901 Segmental and somatic dysfunction of cervical region: Secondary | ICD-10-CM | POA: Diagnosis not present

## 2018-11-07 DIAGNOSIS — M9902 Segmental and somatic dysfunction of thoracic region: Secondary | ICD-10-CM | POA: Diagnosis not present

## 2018-11-07 DIAGNOSIS — M546 Pain in thoracic spine: Secondary | ICD-10-CM | POA: Diagnosis not present

## 2018-11-07 DIAGNOSIS — M542 Cervicalgia: Secondary | ICD-10-CM | POA: Diagnosis not present

## 2018-11-08 DIAGNOSIS — Z86010 Personal history of colonic polyps: Secondary | ICD-10-CM | POA: Diagnosis not present

## 2018-11-08 DIAGNOSIS — K219 Gastro-esophageal reflux disease without esophagitis: Secondary | ICD-10-CM | POA: Diagnosis not present

## 2018-11-08 DIAGNOSIS — K900 Celiac disease: Secondary | ICD-10-CM | POA: Diagnosis not present

## 2018-11-08 DIAGNOSIS — Z1211 Encounter for screening for malignant neoplasm of colon: Secondary | ICD-10-CM | POA: Diagnosis not present

## 2018-11-12 DIAGNOSIS — M9902 Segmental and somatic dysfunction of thoracic region: Secondary | ICD-10-CM | POA: Diagnosis not present

## 2018-11-12 DIAGNOSIS — M546 Pain in thoracic spine: Secondary | ICD-10-CM | POA: Diagnosis not present

## 2018-11-12 DIAGNOSIS — M9901 Segmental and somatic dysfunction of cervical region: Secondary | ICD-10-CM | POA: Diagnosis not present

## 2018-11-12 DIAGNOSIS — M542 Cervicalgia: Secondary | ICD-10-CM | POA: Diagnosis not present

## 2018-12-03 DIAGNOSIS — M546 Pain in thoracic spine: Secondary | ICD-10-CM | POA: Diagnosis not present

## 2018-12-03 DIAGNOSIS — M9902 Segmental and somatic dysfunction of thoracic region: Secondary | ICD-10-CM | POA: Diagnosis not present

## 2018-12-03 DIAGNOSIS — M9901 Segmental and somatic dysfunction of cervical region: Secondary | ICD-10-CM | POA: Diagnosis not present

## 2018-12-03 DIAGNOSIS — M542 Cervicalgia: Secondary | ICD-10-CM | POA: Diagnosis not present

## 2018-12-06 DIAGNOSIS — Z13820 Encounter for screening for osteoporosis: Secondary | ICD-10-CM | POA: Diagnosis not present

## 2018-12-24 ENCOUNTER — Encounter (HOSPITAL_COMMUNITY): Payer: BLUE CROSS/BLUE SHIELD

## 2019-04-04 DIAGNOSIS — E785 Hyperlipidemia, unspecified: Secondary | ICD-10-CM | POA: Diagnosis not present

## 2019-04-04 DIAGNOSIS — M109 Gout, unspecified: Secondary | ICD-10-CM | POA: Diagnosis not present

## 2019-04-04 DIAGNOSIS — M810 Age-related osteoporosis without current pathological fracture: Secondary | ICD-10-CM | POA: Diagnosis not present

## 2019-04-04 DIAGNOSIS — E038 Other specified hypothyroidism: Secondary | ICD-10-CM | POA: Diagnosis not present

## 2019-04-04 DIAGNOSIS — M5010 Cervical disc disorder with radiculopathy, unspecified cervical region: Secondary | ICD-10-CM | POA: Diagnosis not present

## 2019-04-04 DIAGNOSIS — M791 Myalgia, unspecified site: Secondary | ICD-10-CM | POA: Diagnosis not present

## 2019-04-04 DIAGNOSIS — Z79899 Other long term (current) drug therapy: Secondary | ICD-10-CM | POA: Diagnosis not present

## 2019-04-04 DIAGNOSIS — E039 Hypothyroidism, unspecified: Secondary | ICD-10-CM | POA: Diagnosis not present

## 2019-07-11 DIAGNOSIS — I8311 Varicose veins of right lower extremity with inflammation: Secondary | ICD-10-CM | POA: Diagnosis not present

## 2019-07-11 DIAGNOSIS — I83813 Varicose veins of bilateral lower extremities with pain: Secondary | ICD-10-CM | POA: Diagnosis not present

## 2019-07-11 DIAGNOSIS — I8312 Varicose veins of left lower extremity with inflammation: Secondary | ICD-10-CM | POA: Diagnosis not present

## 2019-07-15 DIAGNOSIS — I8390 Asymptomatic varicose veins of unspecified lower extremity: Secondary | ICD-10-CM | POA: Diagnosis not present

## 2019-07-17 DIAGNOSIS — I8311 Varicose veins of right lower extremity with inflammation: Secondary | ICD-10-CM | POA: Diagnosis not present

## 2019-07-17 DIAGNOSIS — I8312 Varicose veins of left lower extremity with inflammation: Secondary | ICD-10-CM | POA: Diagnosis not present

## 2019-07-17 DIAGNOSIS — I83813 Varicose veins of bilateral lower extremities with pain: Secondary | ICD-10-CM | POA: Diagnosis not present

## 2019-08-01 DIAGNOSIS — I83813 Varicose veins of bilateral lower extremities with pain: Secondary | ICD-10-CM | POA: Diagnosis not present

## 2019-08-01 DIAGNOSIS — I8311 Varicose veins of right lower extremity with inflammation: Secondary | ICD-10-CM | POA: Diagnosis not present

## 2019-08-01 DIAGNOSIS — I8312 Varicose veins of left lower extremity with inflammation: Secondary | ICD-10-CM | POA: Diagnosis not present

## 2019-08-08 DIAGNOSIS — M25562 Pain in left knee: Secondary | ICD-10-CM | POA: Diagnosis not present

## 2019-08-08 DIAGNOSIS — M25561 Pain in right knee: Secondary | ICD-10-CM | POA: Diagnosis not present

## 2019-08-22 DIAGNOSIS — I8311 Varicose veins of right lower extremity with inflammation: Secondary | ICD-10-CM | POA: Diagnosis not present

## 2019-08-30 DIAGNOSIS — M79642 Pain in left hand: Secondary | ICD-10-CM | POA: Diagnosis not present

## 2019-08-30 DIAGNOSIS — M79641 Pain in right hand: Secondary | ICD-10-CM | POA: Diagnosis not present

## 2019-09-04 DIAGNOSIS — R5383 Other fatigue: Secondary | ICD-10-CM | POA: Diagnosis not present

## 2019-09-04 DIAGNOSIS — M810 Age-related osteoporosis without current pathological fracture: Secondary | ICD-10-CM | POA: Diagnosis not present

## 2019-09-04 DIAGNOSIS — K900 Celiac disease: Secondary | ICD-10-CM | POA: Diagnosis not present

## 2019-09-04 DIAGNOSIS — M5010 Cervical disc disorder with radiculopathy, unspecified cervical region: Secondary | ICD-10-CM | POA: Diagnosis not present

## 2019-09-04 DIAGNOSIS — M5136 Other intervertebral disc degeneration, lumbar region: Secondary | ICD-10-CM | POA: Diagnosis not present

## 2019-09-04 DIAGNOSIS — Z79899 Other long term (current) drug therapy: Secondary | ICD-10-CM | POA: Diagnosis not present

## 2019-09-04 DIAGNOSIS — E038 Other specified hypothyroidism: Secondary | ICD-10-CM | POA: Diagnosis not present

## 2019-09-04 DIAGNOSIS — N183 Chronic kidney disease, stage 3 (moderate): Secondary | ICD-10-CM | POA: Diagnosis not present

## 2019-09-04 DIAGNOSIS — I8390 Asymptomatic varicose veins of unspecified lower extremity: Secondary | ICD-10-CM | POA: Diagnosis not present

## 2019-09-04 DIAGNOSIS — M109 Gout, unspecified: Secondary | ICD-10-CM | POA: Diagnosis not present

## 2019-09-04 DIAGNOSIS — J45901 Unspecified asthma with (acute) exacerbation: Secondary | ICD-10-CM | POA: Diagnosis not present

## 2019-09-04 DIAGNOSIS — M79643 Pain in unspecified hand: Secondary | ICD-10-CM | POA: Diagnosis not present

## 2019-09-06 ENCOUNTER — Other Ambulatory Visit: Payer: Self-pay | Admitting: Internal Medicine

## 2019-09-06 DIAGNOSIS — M5136 Other intervertebral disc degeneration, lumbar region: Secondary | ICD-10-CM

## 2019-09-11 DIAGNOSIS — K5730 Diverticulosis of large intestine without perforation or abscess without bleeding: Secondary | ICD-10-CM | POA: Diagnosis not present

## 2019-09-11 DIAGNOSIS — D125 Benign neoplasm of sigmoid colon: Secondary | ICD-10-CM | POA: Diagnosis not present

## 2019-09-11 DIAGNOSIS — Z86010 Personal history of colonic polyps: Secondary | ICD-10-CM | POA: Diagnosis not present

## 2019-09-11 DIAGNOSIS — K635 Polyp of colon: Secondary | ICD-10-CM | POA: Diagnosis not present

## 2019-09-11 DIAGNOSIS — Z1211 Encounter for screening for malignant neoplasm of colon: Secondary | ICD-10-CM | POA: Diagnosis not present

## 2019-09-12 DIAGNOSIS — H5213 Myopia, bilateral: Secondary | ICD-10-CM | POA: Diagnosis not present

## 2019-09-12 DIAGNOSIS — Z23 Encounter for immunization: Secondary | ICD-10-CM | POA: Diagnosis not present

## 2019-09-12 DIAGNOSIS — H02831 Dermatochalasis of right upper eyelid: Secondary | ICD-10-CM | POA: Diagnosis not present

## 2019-09-12 DIAGNOSIS — H02834 Dermatochalasis of left upper eyelid: Secondary | ICD-10-CM | POA: Diagnosis not present

## 2019-09-12 DIAGNOSIS — H04123 Dry eye syndrome of bilateral lacrimal glands: Secondary | ICD-10-CM | POA: Diagnosis not present

## 2019-09-17 DIAGNOSIS — Z6833 Body mass index (BMI) 33.0-33.9, adult: Secondary | ICD-10-CM | POA: Diagnosis not present

## 2019-09-17 DIAGNOSIS — I10 Essential (primary) hypertension: Secondary | ICD-10-CM | POA: Diagnosis not present

## 2019-09-17 DIAGNOSIS — R200 Anesthesia of skin: Secondary | ICD-10-CM | POA: Diagnosis not present

## 2019-09-18 DIAGNOSIS — R200 Anesthesia of skin: Secondary | ICD-10-CM | POA: Diagnosis not present

## 2019-09-18 DIAGNOSIS — M542 Cervicalgia: Secondary | ICD-10-CM | POA: Diagnosis not present

## 2019-09-19 ENCOUNTER — Ambulatory Visit
Admission: RE | Admit: 2019-09-19 | Discharge: 2019-09-19 | Disposition: A | Discharge status: Home / Self Care | Payer: Medicare Other | Source: Ambulatory Visit | Attending: Internal Medicine | Admitting: Internal Medicine

## 2019-09-19 ENCOUNTER — Other Ambulatory Visit: Payer: Self-pay

## 2019-09-19 DIAGNOSIS — M5136 Other intervertebral disc degeneration, lumbar region: Secondary | ICD-10-CM

## 2019-09-19 DIAGNOSIS — M48061 Spinal stenosis, lumbar region without neurogenic claudication: Secondary | ICD-10-CM | POA: Diagnosis not present

## 2019-10-02 DIAGNOSIS — M9903 Segmental and somatic dysfunction of lumbar region: Secondary | ICD-10-CM | POA: Diagnosis not present

## 2019-10-02 DIAGNOSIS — M50322 Other cervical disc degeneration at C5-C6 level: Secondary | ICD-10-CM | POA: Diagnosis not present

## 2019-10-02 DIAGNOSIS — M5137 Other intervertebral disc degeneration, lumbosacral region: Secondary | ICD-10-CM | POA: Diagnosis not present

## 2019-10-02 DIAGNOSIS — M9901 Segmental and somatic dysfunction of cervical region: Secondary | ICD-10-CM | POA: Diagnosis not present

## 2019-10-03 DIAGNOSIS — M9901 Segmental and somatic dysfunction of cervical region: Secondary | ICD-10-CM | POA: Diagnosis not present

## 2019-10-03 DIAGNOSIS — M50322 Other cervical disc degeneration at C5-C6 level: Secondary | ICD-10-CM | POA: Diagnosis not present

## 2019-10-03 DIAGNOSIS — M9903 Segmental and somatic dysfunction of lumbar region: Secondary | ICD-10-CM | POA: Diagnosis not present

## 2019-10-03 DIAGNOSIS — M5137 Other intervertebral disc degeneration, lumbosacral region: Secondary | ICD-10-CM | POA: Diagnosis not present

## 2019-10-08 DIAGNOSIS — M79642 Pain in left hand: Secondary | ICD-10-CM | POA: Diagnosis not present

## 2019-10-08 DIAGNOSIS — M79641 Pain in right hand: Secondary | ICD-10-CM | POA: Diagnosis not present

## 2019-10-08 DIAGNOSIS — R202 Paresthesia of skin: Secondary | ICD-10-CM | POA: Insufficient documentation

## 2019-10-08 DIAGNOSIS — M5137 Other intervertebral disc degeneration, lumbosacral region: Secondary | ICD-10-CM | POA: Diagnosis not present

## 2019-10-08 DIAGNOSIS — M9903 Segmental and somatic dysfunction of lumbar region: Secondary | ICD-10-CM | POA: Diagnosis not present

## 2019-10-08 DIAGNOSIS — M9901 Segmental and somatic dysfunction of cervical region: Secondary | ICD-10-CM | POA: Diagnosis not present

## 2019-10-08 DIAGNOSIS — M79643 Pain in unspecified hand: Secondary | ICD-10-CM | POA: Insufficient documentation

## 2019-10-08 DIAGNOSIS — M50322 Other cervical disc degeneration at C5-C6 level: Secondary | ICD-10-CM | POA: Diagnosis not present

## 2019-10-09 DIAGNOSIS — M9901 Segmental and somatic dysfunction of cervical region: Secondary | ICD-10-CM | POA: Diagnosis not present

## 2019-10-09 DIAGNOSIS — M50322 Other cervical disc degeneration at C5-C6 level: Secondary | ICD-10-CM | POA: Diagnosis not present

## 2019-10-09 DIAGNOSIS — M5137 Other intervertebral disc degeneration, lumbosacral region: Secondary | ICD-10-CM | POA: Diagnosis not present

## 2019-10-09 DIAGNOSIS — M9903 Segmental and somatic dysfunction of lumbar region: Secondary | ICD-10-CM | POA: Diagnosis not present

## 2019-10-10 DIAGNOSIS — M50322 Other cervical disc degeneration at C5-C6 level: Secondary | ICD-10-CM | POA: Diagnosis not present

## 2019-10-10 DIAGNOSIS — M5137 Other intervertebral disc degeneration, lumbosacral region: Secondary | ICD-10-CM | POA: Diagnosis not present

## 2019-10-10 DIAGNOSIS — M9901 Segmental and somatic dysfunction of cervical region: Secondary | ICD-10-CM | POA: Diagnosis not present

## 2019-10-10 DIAGNOSIS — M9903 Segmental and somatic dysfunction of lumbar region: Secondary | ICD-10-CM | POA: Diagnosis not present

## 2019-10-14 DIAGNOSIS — M9903 Segmental and somatic dysfunction of lumbar region: Secondary | ICD-10-CM | POA: Diagnosis not present

## 2019-10-14 DIAGNOSIS — M50322 Other cervical disc degeneration at C5-C6 level: Secondary | ICD-10-CM | POA: Diagnosis not present

## 2019-10-14 DIAGNOSIS — M9901 Segmental and somatic dysfunction of cervical region: Secondary | ICD-10-CM | POA: Diagnosis not present

## 2019-10-14 DIAGNOSIS — M5137 Other intervertebral disc degeneration, lumbosacral region: Secondary | ICD-10-CM | POA: Diagnosis not present

## 2019-10-15 DIAGNOSIS — M9901 Segmental and somatic dysfunction of cervical region: Secondary | ICD-10-CM | POA: Diagnosis not present

## 2019-10-15 DIAGNOSIS — M50322 Other cervical disc degeneration at C5-C6 level: Secondary | ICD-10-CM | POA: Diagnosis not present

## 2019-10-15 DIAGNOSIS — M5137 Other intervertebral disc degeneration, lumbosacral region: Secondary | ICD-10-CM | POA: Diagnosis not present

## 2019-10-15 DIAGNOSIS — M9903 Segmental and somatic dysfunction of lumbar region: Secondary | ICD-10-CM | POA: Diagnosis not present

## 2019-10-15 DIAGNOSIS — M79641 Pain in right hand: Secondary | ICD-10-CM | POA: Diagnosis not present

## 2019-10-17 DIAGNOSIS — M5137 Other intervertebral disc degeneration, lumbosacral region: Secondary | ICD-10-CM | POA: Diagnosis not present

## 2019-10-17 DIAGNOSIS — M9901 Segmental and somatic dysfunction of cervical region: Secondary | ICD-10-CM | POA: Diagnosis not present

## 2019-10-17 DIAGNOSIS — M9903 Segmental and somatic dysfunction of lumbar region: Secondary | ICD-10-CM | POA: Diagnosis not present

## 2019-10-17 DIAGNOSIS — M50322 Other cervical disc degeneration at C5-C6 level: Secondary | ICD-10-CM | POA: Diagnosis not present

## 2019-10-21 DIAGNOSIS — M9901 Segmental and somatic dysfunction of cervical region: Secondary | ICD-10-CM | POA: Diagnosis not present

## 2019-10-21 DIAGNOSIS — M5137 Other intervertebral disc degeneration, lumbosacral region: Secondary | ICD-10-CM | POA: Diagnosis not present

## 2019-10-21 DIAGNOSIS — M9903 Segmental and somatic dysfunction of lumbar region: Secondary | ICD-10-CM | POA: Diagnosis not present

## 2019-10-21 DIAGNOSIS — M50322 Other cervical disc degeneration at C5-C6 level: Secondary | ICD-10-CM | POA: Diagnosis not present

## 2019-10-22 DIAGNOSIS — Z6832 Body mass index (BMI) 32.0-32.9, adult: Secondary | ICD-10-CM | POA: Diagnosis not present

## 2019-10-22 DIAGNOSIS — M1A09X0 Idiopathic chronic gout, multiple sites, without tophus (tophi): Secondary | ICD-10-CM | POA: Diagnosis not present

## 2019-10-22 DIAGNOSIS — Z862 Personal history of diseases of the blood and blood-forming organs and certain disorders involving the immune mechanism: Secondary | ICD-10-CM | POA: Diagnosis not present

## 2019-10-22 DIAGNOSIS — M2550 Pain in unspecified joint: Secondary | ICD-10-CM | POA: Diagnosis not present

## 2019-10-22 DIAGNOSIS — M150 Primary generalized (osteo)arthritis: Secondary | ICD-10-CM | POA: Diagnosis not present

## 2019-10-22 DIAGNOSIS — E669 Obesity, unspecified: Secondary | ICD-10-CM | POA: Diagnosis not present

## 2019-11-06 DIAGNOSIS — Z6832 Body mass index (BMI) 32.0-32.9, adult: Secondary | ICD-10-CM | POA: Diagnosis not present

## 2019-11-06 DIAGNOSIS — E669 Obesity, unspecified: Secondary | ICD-10-CM | POA: Diagnosis not present

## 2019-11-06 DIAGNOSIS — M1A09X0 Idiopathic chronic gout, multiple sites, without tophus (tophi): Secondary | ICD-10-CM | POA: Diagnosis not present

## 2019-11-06 DIAGNOSIS — M150 Primary generalized (osteo)arthritis: Secondary | ICD-10-CM | POA: Diagnosis not present

## 2019-11-06 DIAGNOSIS — M1990 Unspecified osteoarthritis, unspecified site: Secondary | ICD-10-CM | POA: Diagnosis not present

## 2019-11-06 DIAGNOSIS — Z862 Personal history of diseases of the blood and blood-forming organs and certain disorders involving the immune mechanism: Secondary | ICD-10-CM | POA: Diagnosis not present

## 2019-11-07 DIAGNOSIS — K600 Acute anal fissure: Secondary | ICD-10-CM | POA: Diagnosis not present

## 2019-11-07 DIAGNOSIS — K900 Celiac disease: Secondary | ICD-10-CM | POA: Diagnosis not present

## 2019-11-07 DIAGNOSIS — Z86010 Personal history of colonic polyps: Secondary | ICD-10-CM | POA: Diagnosis not present

## 2019-11-07 DIAGNOSIS — K6289 Other specified diseases of anus and rectum: Secondary | ICD-10-CM | POA: Diagnosis not present

## 2019-11-08 DIAGNOSIS — N993 Prolapse of vaginal vault after hysterectomy: Secondary | ICD-10-CM | POA: Diagnosis not present

## 2019-11-08 DIAGNOSIS — N8111 Cystocele, midline: Secondary | ICD-10-CM | POA: Diagnosis not present

## 2019-11-11 DIAGNOSIS — M50322 Other cervical disc degeneration at C5-C6 level: Secondary | ICD-10-CM | POA: Diagnosis not present

## 2019-11-11 DIAGNOSIS — M9901 Segmental and somatic dysfunction of cervical region: Secondary | ICD-10-CM | POA: Diagnosis not present

## 2019-11-11 DIAGNOSIS — M5137 Other intervertebral disc degeneration, lumbosacral region: Secondary | ICD-10-CM | POA: Diagnosis not present

## 2019-11-11 DIAGNOSIS — M9903 Segmental and somatic dysfunction of lumbar region: Secondary | ICD-10-CM | POA: Diagnosis not present

## 2019-11-12 ENCOUNTER — Ambulatory Visit (INDEPENDENT_AMBULATORY_CARE_PROVIDER_SITE_OTHER): Payer: Medicare Other | Admitting: Podiatry

## 2019-11-12 ENCOUNTER — Encounter: Payer: Self-pay | Admitting: Podiatry

## 2019-11-12 ENCOUNTER — Other Ambulatory Visit: Payer: Self-pay

## 2019-11-12 VITALS — BP 104/64 | HR 83

## 2019-11-12 DIAGNOSIS — B351 Tinea unguium: Secondary | ICD-10-CM | POA: Diagnosis not present

## 2019-11-12 DIAGNOSIS — M2012 Hallux valgus (acquired), left foot: Secondary | ICD-10-CM

## 2019-11-12 DIAGNOSIS — M2011 Hallux valgus (acquired), right foot: Secondary | ICD-10-CM

## 2019-11-12 DIAGNOSIS — M2042 Other hammer toe(s) (acquired), left foot: Secondary | ICD-10-CM

## 2019-11-12 DIAGNOSIS — M79674 Pain in right toe(s): Secondary | ICD-10-CM | POA: Diagnosis not present

## 2019-11-12 DIAGNOSIS — M79675 Pain in left toe(s): Secondary | ICD-10-CM | POA: Diagnosis not present

## 2019-11-12 DIAGNOSIS — M2041 Other hammer toe(s) (acquired), right foot: Secondary | ICD-10-CM

## 2019-11-12 DIAGNOSIS — Z1231 Encounter for screening mammogram for malignant neoplasm of breast: Secondary | ICD-10-CM | POA: Diagnosis not present

## 2019-11-12 NOTE — Patient Instructions (Addendum)
EPSOM SALT FOOT SOAK INSTRUCTIONS  1.  Place 1/4 cup of epsom salts in 2 quarts of warm tap water. IF YOU ARE DIABETIC, OR HAVE NEUROPATHY,  CHECK THE TEMPERATURE OF THE WATER WITH YOUR ELBOW.  2.  Submerge your foot/feet in the solution and soak for 20 minutes.      3.  Next, remove your foot or feet from solution, blot dry the affected area.    4.  Apply antibiotic ointment and cover with fabric band-aid .  5.  This soak should be done once a day for 7 days.  6.  Monitor for any signs/symptoms of infection such as redness, swelling, odor, drainage, increased pain, or non-healing of digit.   7.  Please do not hesitate to call the office and speak to a Nurse or Doctor if you have questions.   8.  If you experience fever, chills, nightsweats, nausea or vomiting with worsening of digit, please go to the emergency room.   Ingrown Toenail An ingrown toenail occurs when the corner or sides of a toenail grow into the surrounding skin. This causes discomfort and pain. The big toe is most commonly affected, but any of the toes can be affected. If an ingrown toenail is not treated, it can become infected. What are the causes? This condition may be caused by:  Wearing shoes that are too small or tight.  An injury, such as stubbing your toe or having your toe stepped on.  Improper cutting or care of your toenails.  Having nail or foot abnormalities that were present from birth (congenital abnormalities), such as having a nail that is too big for your toe. What increases the risk? The following factors may make you more likely to develop ingrown toenails:  Age. Nails tend to get thicker with age, so ingrown nails are more common among older people.  Cutting your toenails incorrectly, such as cutting them very short or cutting them unevenly. An ingrown toenail is more likely to get infected if you have:  Diabetes.  Blood flow (circulation) problems. What are the signs or  symptoms? Symptoms of an ingrown toenail may include:  Pain, soreness, or tenderness.  Redness.  Swelling.  Hardening of the skin that surrounds the toenail. Signs that an ingrown toenail may be infected include:  Fluid or pus.  Symptoms that get worse instead of better. How is this diagnosed? An ingrown toenail may be diagnosed based on your medical history, your symptoms, and a physical exam. If you have fluid or blood coming from your toenail, a sample may be collected to test for the specific type of bacteria that is causing the infection. How is this treated? Treatment depends on how severe your ingrown toenail is. You may be able to care for your toenail at home.  If you have an infection, you may be prescribed antibiotic medicines.  If you have fluid or pus draining from your toenail, your health care provider may drain it.  If you have trouble walking, you may be given crutches to use.  If you have a severe or infected ingrown toenail, you may need a procedure to remove part or all of the nail. Follow these instructions at home: Foot care   Do not pick at your toenail or try to remove it yourself.  Soak your foot in warm, soapy water. Do this for 20 minutes, 3 times a day, or as often as told by your health care provider. This helps to keep your toe clean  and keep your skin soft.  Wear shoes that fit well and are not too tight. Your health care provider may recommend that you wear open-toed shoes while you heal.  Trim your toenails regularly and carefully. Cut your toenails straight across to prevent injury to the skin at the corners of the toenail. Do not cut your nails in a curved shape.  Keep your feet clean and dry to help prevent infection. Medicines  Take over-the-counter and prescription medicines only as told by your health care provider.  If you were prescribed an antibiotic, take it as told by your health care provider. Do not stop taking the antibiotic  even if you start to feel better. Activity  Return to your normal activities as told by your health care provider. Ask your health care provider what activities are safe for you.  Avoid activities that cause pain. General instructions  If your health care provider told you to use crutches to help you move around, use them as instructed.  Keep all follow-up visits as told by your health care provider. This is important. Contact a health care provider if:  You have more redness, swelling, pain, or other symptoms that do not improve with treatment.  You have fluid, blood, or pus coming from your toenail. Get help right away if:  You have a red streak on your skin that starts at your foot and spreads up your leg.  You have a fever. Summary  An ingrown toenail occurs when the corner or sides of a toenail grow into the surrounding skin. This causes discomfort and pain. The big toe is most commonly affected, but any of the toes can be affected.  If an ingrown toenail is not treated, it can become infected.  Fluid or pus draining from your toenail is a sign of infection. Your health care provider may need to drain it. You may be given antibiotics to treat the infection.  Trimming your toenails regularly and properly can help you prevent an ingrown toenail. This information is not intended to replace advice given to you by your health care provider. Make sure you discuss any questions you have with your health care provider. Document Released: 12/09/2000 Document Revised: 04/05/2019 Document Reviewed: 08/30/2017 Elsevier Patient Education  2020 [employer].

## 2019-11-14 DIAGNOSIS — M810 Age-related osteoporosis without current pathological fracture: Secondary | ICD-10-CM | POA: Diagnosis not present

## 2019-11-14 DIAGNOSIS — M109 Gout, unspecified: Secondary | ICD-10-CM | POA: Diagnosis not present

## 2019-11-14 DIAGNOSIS — E038 Other specified hypothyroidism: Secondary | ICD-10-CM | POA: Diagnosis not present

## 2019-11-14 DIAGNOSIS — E7849 Other hyperlipidemia: Secondary | ICD-10-CM | POA: Diagnosis not present

## 2019-11-14 DIAGNOSIS — M1380 Other specified arthritis, unspecified site: Secondary | ICD-10-CM | POA: Diagnosis not present

## 2019-11-15 DIAGNOSIS — M1380 Other specified arthritis, unspecified site: Secondary | ICD-10-CM | POA: Diagnosis not present

## 2019-11-18 DIAGNOSIS — Z1331 Encounter for screening for depression: Secondary | ICD-10-CM | POA: Diagnosis not present

## 2019-11-18 DIAGNOSIS — R82998 Other abnormal findings in urine: Secondary | ICD-10-CM | POA: Diagnosis not present

## 2019-11-18 DIAGNOSIS — M5137 Other intervertebral disc degeneration, lumbosacral region: Secondary | ICD-10-CM | POA: Diagnosis not present

## 2019-11-18 DIAGNOSIS — J45901 Unspecified asthma with (acute) exacerbation: Secondary | ICD-10-CM | POA: Diagnosis not present

## 2019-11-18 DIAGNOSIS — R011 Cardiac murmur, unspecified: Secondary | ICD-10-CM | POA: Diagnosis not present

## 2019-11-18 DIAGNOSIS — M791 Myalgia, unspecified site: Secondary | ICD-10-CM | POA: Diagnosis not present

## 2019-11-18 DIAGNOSIS — M9901 Segmental and somatic dysfunction of cervical region: Secondary | ICD-10-CM | POA: Diagnosis not present

## 2019-11-18 DIAGNOSIS — M1380 Other specified arthritis, unspecified site: Secondary | ICD-10-CM | POA: Diagnosis not present

## 2019-11-18 DIAGNOSIS — N1831 Chronic kidney disease, stage 3a: Secondary | ICD-10-CM | POA: Diagnosis not present

## 2019-11-18 DIAGNOSIS — M9903 Segmental and somatic dysfunction of lumbar region: Secondary | ICD-10-CM | POA: Diagnosis not present

## 2019-11-18 DIAGNOSIS — I708 Atherosclerosis of other arteries: Secondary | ICD-10-CM | POA: Diagnosis not present

## 2019-11-18 DIAGNOSIS — K5730 Diverticulosis of large intestine without perforation or abscess without bleeding: Secondary | ICD-10-CM | POA: Diagnosis not present

## 2019-11-18 DIAGNOSIS — Z0000 Encounter for general adult medical examination without abnormal findings: Secondary | ICD-10-CM | POA: Diagnosis not present

## 2019-11-18 DIAGNOSIS — M7989 Other specified soft tissue disorders: Secondary | ICD-10-CM | POA: Diagnosis not present

## 2019-11-18 DIAGNOSIS — M810 Age-related osteoporosis without current pathological fracture: Secondary | ICD-10-CM | POA: Diagnosis not present

## 2019-11-18 DIAGNOSIS — J309 Allergic rhinitis, unspecified: Secondary | ICD-10-CM | POA: Diagnosis not present

## 2019-11-18 DIAGNOSIS — M5136 Other intervertebral disc degeneration, lumbar region: Secondary | ICD-10-CM | POA: Diagnosis not present

## 2019-11-18 DIAGNOSIS — M50322 Other cervical disc degeneration at C5-C6 level: Secondary | ICD-10-CM | POA: Diagnosis not present

## 2019-11-20 NOTE — Progress Notes (Signed)
Subjective: Jessica Esparza presents today referred by Crist Infante, MD with cc of painful, discolored, thick toenails which interfere with daily activities.  Pain is aggravated when wearing enclosed shoe gear.   Past Medical History:  Diagnosis Date  . Asthma   . Celiac disease   . Osteoporosis   . Thyroid disease      Patient Active Problem List   Diagnosis Date Noted  . Asthmatic bronchitis 07/20/2017  . Epistaxis 10/10/2016     Past Surgical History:  Procedure Laterality Date  . ABDOMINAL HYSTERECTOMY    . CHOLECYSTECTOMY    . SINUS EXPLORATION       Current Outpatient Medications on File Prior to Visit  Medication Sig Dispense Refill  . albuterol (PROVENTIL HFA;VENTOLIN HFA) 108 (90 Base) MCG/ACT inhaler Inhale 2 puffs into the lungs every 6 (six) hours as needed.     Marland Kitchen allopurinol (ZYLOPRIM) 100 MG tablet Take 100 mg by mouth daily.    Marland Kitchen ALPRAZolam (XANAX) 0.5 MG tablet alprazolam 0.5 mg tablet    . cetirizine (ZYRTEC) 10 MG tablet Take 10 mg by mouth.    . clobetasol cream (TEMOVATE) 0.05 % Apply topically.    . colchicine 0.6 MG tablet Take 0.6 mg by mouth daily as needed.     Marland Kitchen estrogens, conjugated, (PREMARIN) 0.45 MG tablet Take by mouth.    . famotidine (PEPCID) 20 MG tablet Take 20 mg by mouth.    . fluticasone (FLONASE) 50 MCG/ACT nasal spray     . Fluticasone Propionate (FLONASE NA) Place into the nose 2 (two) times daily.    Marland Kitchen HYDROcodone-acetaminophen (NORCO/VICODIN) 5-325 MG tablet Take 1 tablet by mouth every 6 (six) hours as needed for severe pain. 15 tablet 0  . hydroxychloroquine (PLAQUENIL) 200 MG tablet Take 200 mg by mouth 2 (two) times daily.    . Levothyroxine Sodium (SYNTHROID PO) Take 150 mcg by mouth daily.     Marland Kitchen lidocaine (LIDODERM) 5 % Place 1 patch onto the skin daily. Remove & Discard patch within 12 hours or as directed by MD 30 patch 0  . losartan (COZAAR) 50 MG tablet losartan 50 mg tablet    . mometasone (ASMANEX 60 METERED DOSES) 220  MCG/INH inhaler Inhale 2 puffs into the lungs daily.    . predniSONE (DELTASONE) 5 MG tablet     . simvastatin (ZOCOR) 20 MG tablet Take 20 mg by mouth daily.    . sucralfate (CARAFATE) 1 g tablet Take 1 g by mouth 4 (four) times daily -  with meals and at bedtime.    . SYMBICORT 160-4.5 MCG/ACT inhaler     . Zoster Vaccine Adjuvanted [facility]) injection Shingrix (PF) 50 mcg/0.5 mL intramuscular suspension, kit     No current facility-administered medications on file prior to visit.      Allergies  Allergen Reactions  . Nsaids Shortness Of Breath    Wheezing  . Aspirin Other (See Comments)    Other  . Cefdinir     Reported on June 2018  . Penicillins Hives     Social History   Occupational History  . Not on file  Tobacco Use  . Smoking status: Former Smoker    Packs/day: 1.00    Years: 20.00    Pack years: 20.00    Types: Cigarettes    Quit date: 12/27/1979    Years since quitting: 39.9  . Smokeless tobacco: Never Used  Substance and Sexual Activity  . Alcohol use: Not on file  .  Drug use: Not on file  . Sexual activity: Not on file     History reviewed. No pertinent family history.   Immunization History  Administered Date(s) Administered  . Influenza, High Dose Seasonal PF 08/28/2017  . Influenza-Unspecified 09/16/2016     Review of systems: Positive Findings in bold print.  Constitutional:  chills, fatigue, fever, sweats, weight change Communication: Optometrist, sign Ecologist, hand writing, iPad/Android device Head: headaches, head injury Eyes: changes in vision, eye pain, glaucoma, cataracts, macular degeneration, diplopia, glare,  light sensitivity, eyeglasses or contacts, blindness Ears nose mouth throat: hearing impaired, hearing aids,  ringing in ears, deaf, sign language,  vertigo,   nosebleeds,  rhinitis,  cold sores, snoring, swollen glands Cardiovascular: HTN, edema, arrhythmia, pacemaker in place, defibrillator in place, chest  pain/tightness, chronic anticoagulation, blood clot, heart failure, MI Peripheral Vascular: leg cramps, varicose veins, blood clots, lymphedema, varicosities Respiratory:  Asthma, difficulty breathing, denies congestion, SOB, wheezing, cough, emphysema Gastrointestinal: change in appetite or weight, abdominal pain, constipation, diarrhea, nausea, vomiting, vomiting blood, change in bowel habits, abdominal pain, jaundice, rectal bleeding, hemorrhoids, GERD Genitourinary:  nocturia,  pain on urination, polyuria,  blood in urine, Foley catheter, urinary urgency, ESRD on hemodialysis Musculoskeletal: amputation, cramping, stiff joints, painful joints, decreased joint motion, fractures, OA, gout, hemiplegia, paraplegia, uses cane, wheelchair bound, uses walker, uses rollator, osteoporosis Skin: +changes in toenails, color change, dryness, itching, mole changes,  rash, wound(s) Neurological: headaches, numbness in feet, paresthesias in feet, burning in feet, fainting,  seizures, change in speech. denies headaches, memory problems/poor historian, cerebral palsy, weakness, paralysis, CVA, TIA Endocrine: diabetes, hypothyroidism, hyperthyroidism,  goiter, dry mouth, flushing, heat intolerance,  cold intolerance,  excessive thirst, denies polyuria,  nocturia Hematological:  easy bleeding, excessive bleeding, easy bruising, enlarged lymph nodes, on long term blood thinner, history of past transusions Allergy/immunological:  hives, eczema, frequent infections, multiple drug allergies, seasonal allergies, transplant recipient, multiple food allergies Psychiatric:  anxiety, depression, mood disorder, suicidal ideations, hallucinations, insomnia  Objective: Vitals:   11/12/19 1410  BP: 104/64  Pulse: 83    Vascular Examination: Capillary refill time immediate b/l.  Dorsalis pedis pulses palpable b/l.   Posterior tibial pulses palpable b/l.   Sparse digital hair x 10 digits.  Skin temperature gradient  WNL b/l.  Dermatological Examination: Skin with normal turgor, texture and tone b/l.  Toenails 1-5 b/l discolored, thick, dystrophic with subungual debris and pain with palpation to nailbeds due to thickness of nails.  Incurvated nailplate right great toe medial border with tenderness to palpation. No erythema, no edema, no drainage noted.  Musculoskeletal: Muscle strength 5/5 to all LE muscle groups b/l.  HAV with bunion deformity b/l.  Hammertoe 5th digit left foot.  Neurological: Sensation intact with 10 gram monofilament.  Vibratory sensation intact.  Assessment: 1. Painful onychomycosis toenails 1-5 b/l  2. HAV with bunion deformity 3. Hammertoe left 5th digit  Plan: 1. Discussed onychomycosis and treatment options.  Literature dispensed on today. 2. Toenails 1-5 b/l were debrided in length and girth without iatrogenic bleeding. Offending nail border debrided and curretaged right hallux. Border cleansed with alcohol and triple antibiotic applied. No further treatment required by patient/caregiver. 3. Recommend wide shoe gear with high toe box. Patient to continue soft, supportive shoe gear daily. 4. Patient to report any pedal injuries to medical professional immediately. 5. Follow up 3 months.  6. Patient/POA to call should there be a concern in the interim.

## 2019-12-06 DIAGNOSIS — M545 Low back pain, unspecified: Secondary | ICD-10-CM | POA: Insufficient documentation

## 2019-12-26 DIAGNOSIS — M25559 Pain in unspecified hip: Secondary | ICD-10-CM | POA: Insufficient documentation

## 2019-12-26 DIAGNOSIS — M48061 Spinal stenosis, lumbar region without neurogenic claudication: Secondary | ICD-10-CM | POA: Insufficient documentation

## 2020-01-28 DIAGNOSIS — M1A09X0 Idiopathic chronic gout, multiple sites, without tophus (tophi): Secondary | ICD-10-CM | POA: Diagnosis not present

## 2020-01-28 DIAGNOSIS — Z79899 Other long term (current) drug therapy: Secondary | ICD-10-CM | POA: Diagnosis not present

## 2020-01-28 DIAGNOSIS — M1990 Unspecified osteoarthritis, unspecified site: Secondary | ICD-10-CM | POA: Diagnosis not present

## 2020-01-28 DIAGNOSIS — Z862 Personal history of diseases of the blood and blood-forming organs and certain disorders involving the immune mechanism: Secondary | ICD-10-CM | POA: Diagnosis not present

## 2020-01-28 DIAGNOSIS — Z6833 Body mass index (BMI) 33.0-33.9, adult: Secondary | ICD-10-CM | POA: Diagnosis not present

## 2020-01-28 DIAGNOSIS — E669 Obesity, unspecified: Secondary | ICD-10-CM | POA: Diagnosis not present

## 2020-01-28 DIAGNOSIS — M150 Primary generalized (osteo)arthritis: Secondary | ICD-10-CM | POA: Diagnosis not present

## 2020-02-05 DIAGNOSIS — M48061 Spinal stenosis, lumbar region without neurogenic claudication: Secondary | ICD-10-CM | POA: Diagnosis not present

## 2020-02-05 DIAGNOSIS — M5416 Radiculopathy, lumbar region: Secondary | ICD-10-CM | POA: Diagnosis not present

## 2020-02-12 ENCOUNTER — Other Ambulatory Visit: Payer: Self-pay

## 2020-02-12 ENCOUNTER — Encounter: Payer: Self-pay | Admitting: Podiatry

## 2020-02-12 ENCOUNTER — Ambulatory Visit (INDEPENDENT_AMBULATORY_CARE_PROVIDER_SITE_OTHER): Payer: Medicare Other | Admitting: Podiatry

## 2020-02-12 DIAGNOSIS — B351 Tinea unguium: Secondary | ICD-10-CM | POA: Diagnosis not present

## 2020-02-12 DIAGNOSIS — M79675 Pain in left toe(s): Secondary | ICD-10-CM

## 2020-02-12 DIAGNOSIS — M79674 Pain in right toe(s): Secondary | ICD-10-CM | POA: Diagnosis not present

## 2020-02-12 NOTE — Patient Instructions (Signed)

## 2020-02-17 NOTE — Progress Notes (Signed)
Subjective: Jessica Esparza presents today for follow up of painful mycotic nails b/l that are difficult to trim. Pain interferes with ambulation. Aggravating factors include wearing enclosed shoe gear. Pain is relieved with periodic professional debridement.   She voices no new pedal complaints on today's visit.  Allergies  Allergen Reactions  . Nsaids Shortness Of Breath    Wheezing  . Aspirin Other (See Comments)    Other  . Cefdinir     Reported on June 2018  . Esomeprazole Magnesium Nausea And Vomiting    Most meds for GERD  . Penicillins Hives     Objective: There were no vitals filed for this visit.  Vascular Examination:  Capillary refill time to digits immediate b/l, palpable DP pulses b/l, palpable PT pulses b/l, pedal hair sparse b/l and skin temperature gradient within normal limits b/l  Dermatological Examination: Pedal skin with normal turgor, texture and tone bilaterally, no open wounds bilaterally, no interdigital macerations bilaterally and toenails 1-5 b/l elongated, dystrophic, thickened, crumbly with subungual debris  Musculoskeletal: Normal muscle strength 5/5 to all lower extremity muscle groups bilaterally, no pain crepitus or joint limitation noted with ROM b/l, bunion deformity noted b/l and hammertoes noted to the  L 5th toe  Neurological: Protective sensation intact 5/5 intact bilaterally with 10g monofilament b/l and vibratory sensation intact b/l  Assessment: 1. Pain due to onychomycosis of toenails of both feet    Plan: -Toenails 1-5 b/l were debrided in length and girth with sterile nail nippers and dremel without iatrogenic bleeding. -Patient to continue soft, supportive shoe gear daily. -Patient to report any pedal injuries to medical professional immediately. -Patient/POA to call should there be question/concern in the interim.  Return in about 3 months (around 05/11/2020) for nail trim.

## 2020-02-27 DIAGNOSIS — L820 Inflamed seborrheic keratosis: Secondary | ICD-10-CM | POA: Diagnosis not present

## 2020-02-27 DIAGNOSIS — L821 Other seborrheic keratosis: Secondary | ICD-10-CM | POA: Diagnosis not present

## 2020-02-27 DIAGNOSIS — L570 Actinic keratosis: Secondary | ICD-10-CM | POA: Diagnosis not present

## 2020-02-27 DIAGNOSIS — D225 Melanocytic nevi of trunk: Secondary | ICD-10-CM | POA: Diagnosis not present

## 2020-02-27 DIAGNOSIS — Z85828 Personal history of other malignant neoplasm of skin: Secondary | ICD-10-CM | POA: Diagnosis not present

## 2020-03-16 IMAGING — MR MR LUMBAR SPINE W/O CM
5 series · 42 of 48 positions shown · non-contrast
Comparison: None.

CLINICAL DATA: Six months of right leg pain

EXAM:
MRI LUMBAR SPINE WITHOUT CONTRAST
TECHNIQUE: Multiplanar, multisequence MR imaging of the lumbar spine was
performed. No intravenous contrast was administered.

[Series 3: T2 · sagittal · 4.0mm · 0.81mm/px · 6 of 15 slices shown (1 of 2)]
[im 1/15]
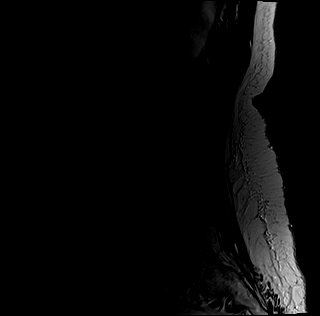
[im 3/15]
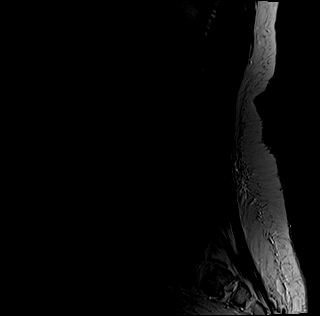
[im 6/15]
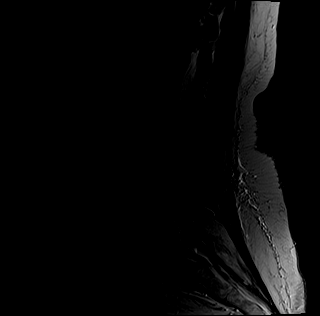
[im 9/15]
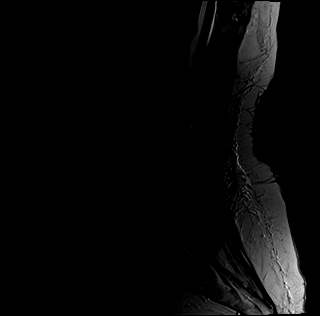
[im 12/15]
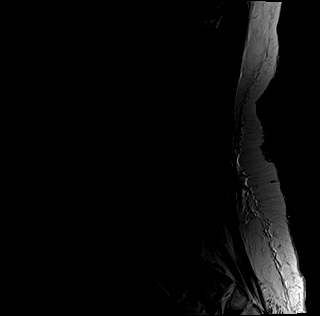
[im 15/15]
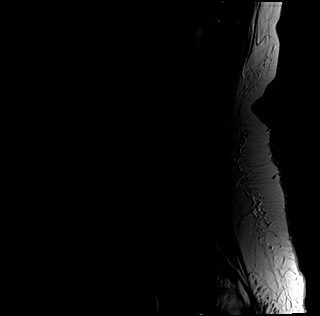

[Series 4: tirm sag · sagittal · 4.0mm · 0.51mm/px · 6 of 15 slices shown]
[im 1/15]
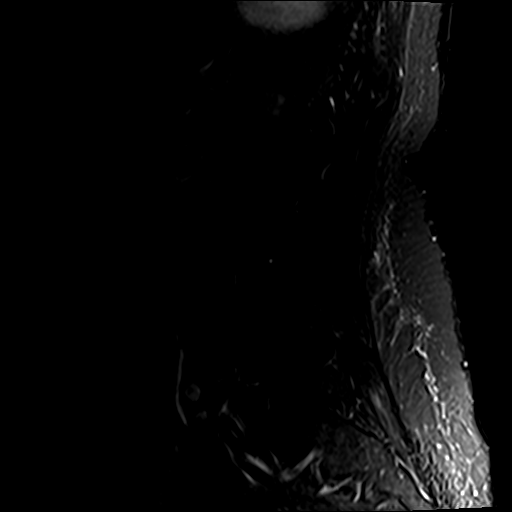
[im 3/15]
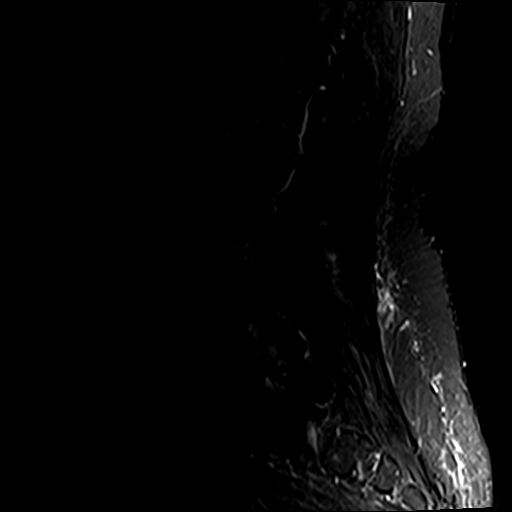
[im 6/15]
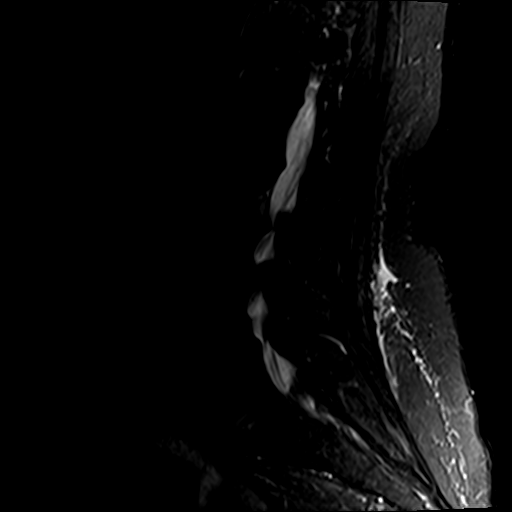
[im 9/15]
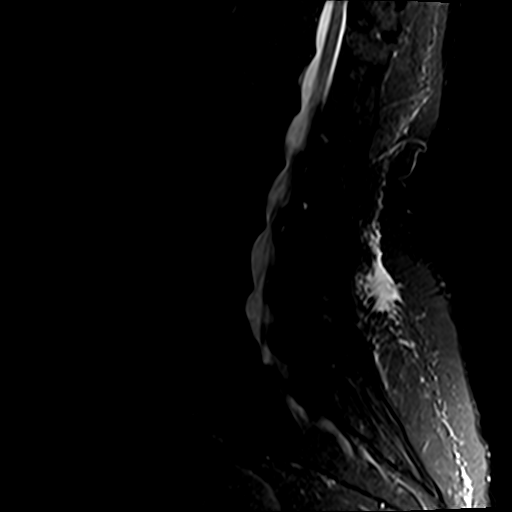
[im 12/15]
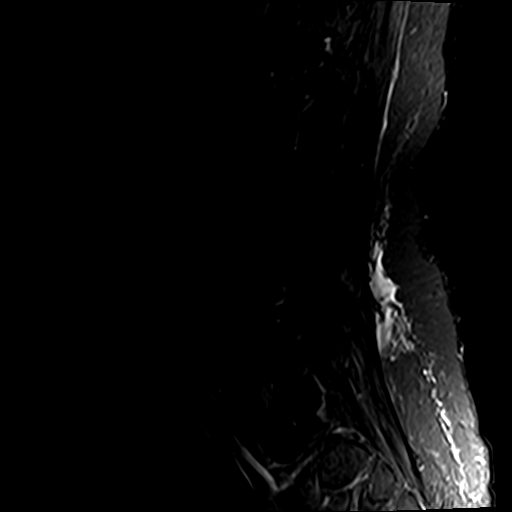
[im 15/15]
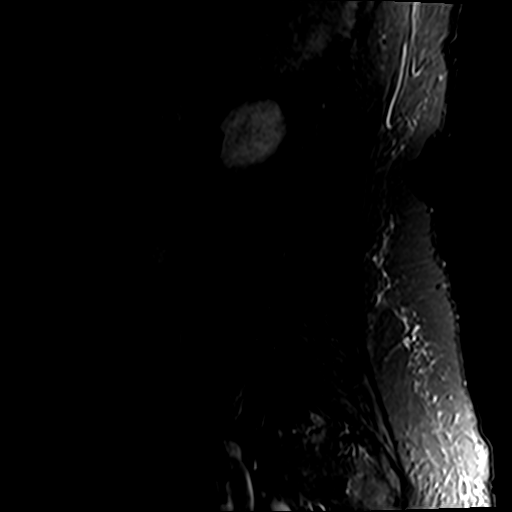

[Series 5: T1 · sagittal · 4.0mm · 0.81mm/px · 6 of 15 slices shown (1 of 2)]
[im 1/15]
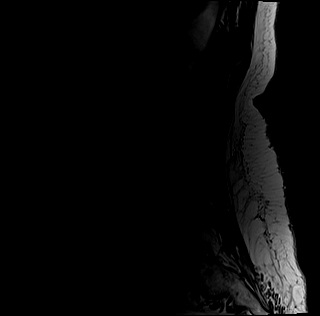
[im 3/15]
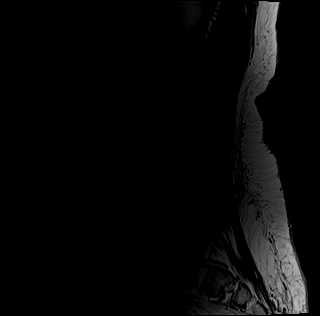
[im 6/15]
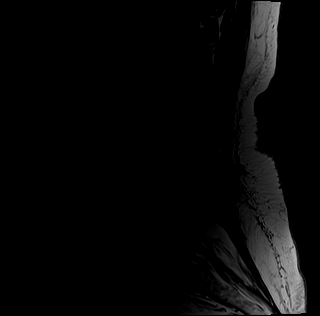
[im 9/15]
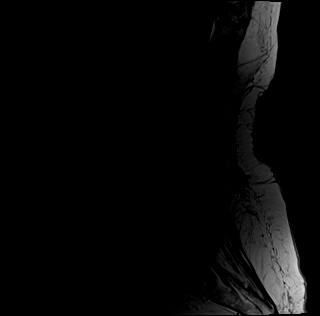
[im 12/15]
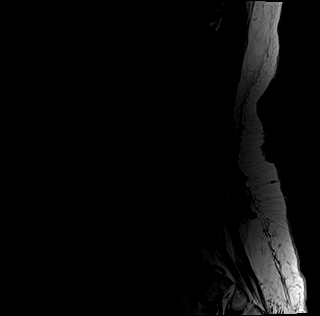
[im 15/15]
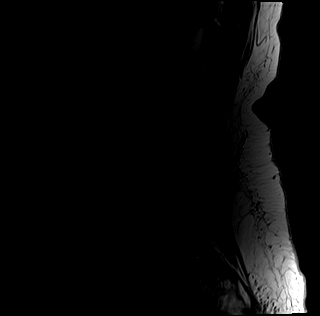

[Series 6: T1 · axial · 4.0mm · 0.70mm/px · z∈[-59,+148]mm · 9 of 35 slices shown (2 of 2)]
[im 1/35]
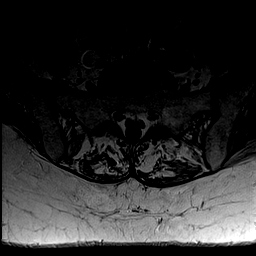
[im 5/35]
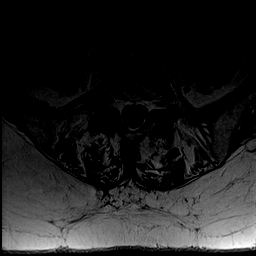
[im 10/35]
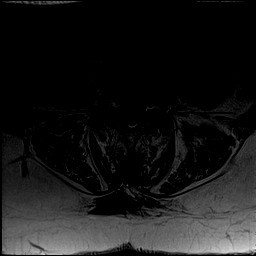
[im 15/35]
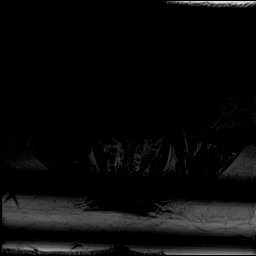
[im 18/35]
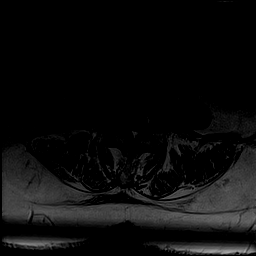
[im 20/35]
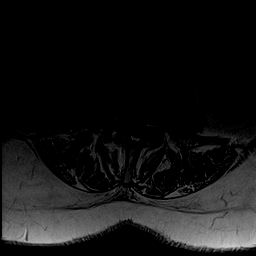
[im 25/35]
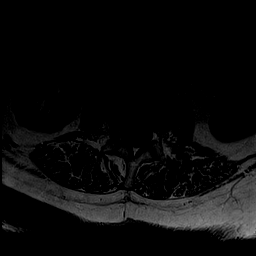
[im 30/35]
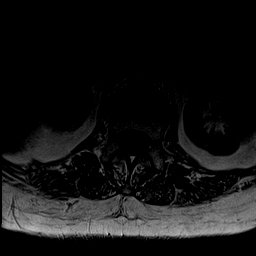
[im 35/35]
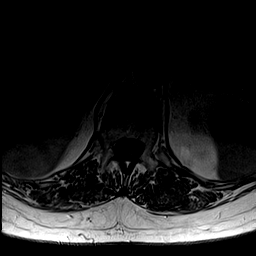

[Series 7: T2 · axial · 4.0mm · 0.70mm/px · z∈[-59,+148]mm · 15 of 35 slices shown (2 of 2)]
[im 1/35]
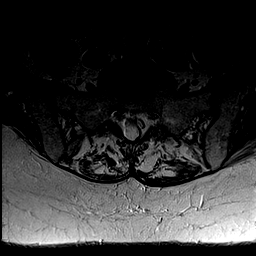
[im 3/35]
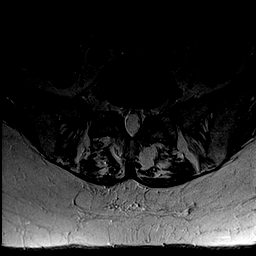
[im 5/35]
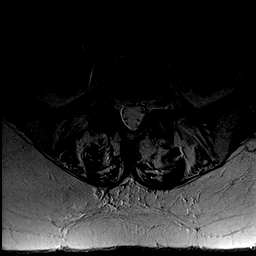
[im 8/35]
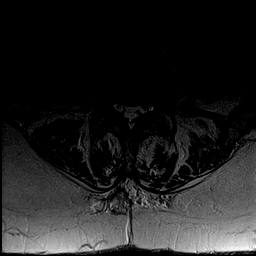
[im 10/35]
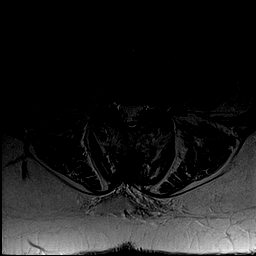
[im 13/35]
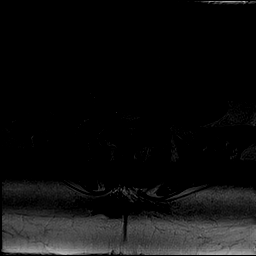
[im 15/35]
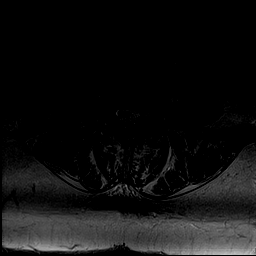
[im 18/35]
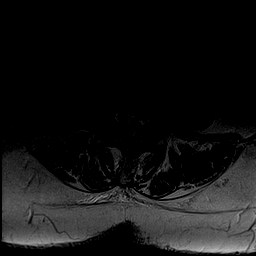
[im 20/35]
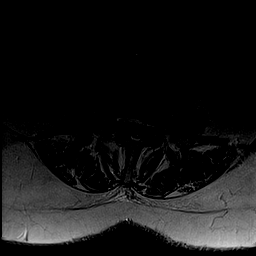
[im 22/35]
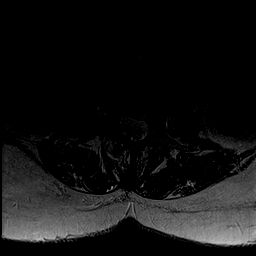
[im 25/35]
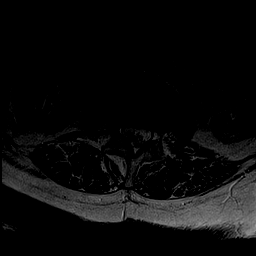
[im 27/35]
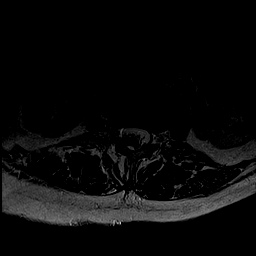
[im 30/35]
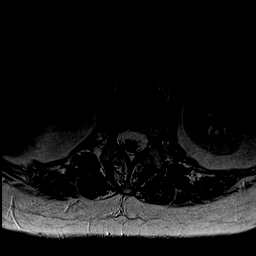
[im 32/35]
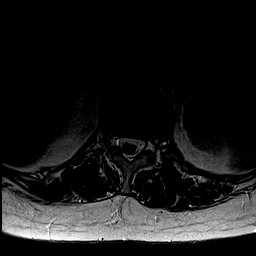
[im 35/35]
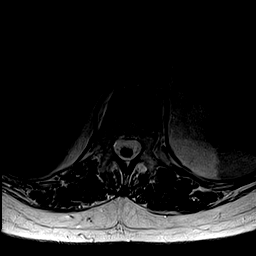

[42 of 48 positions shown; findings below may reference images not displayed]

FINDINGS: Segmentation: There are 5 non-rib bearing lumbar type vertebral
bodies with the last intervertebral disc space labeled as L5-S1.

Alignment: There is a grade 1 anterolisthesis of L4 on L5 measuring
5 mm.

Vertebrae: The vertebral body heights are well maintained. No
fracture, marrow edema,or pathologic marrow infiltration. There is a
chronic superior compression deformity of the L2 vertebral body with
25% loss in vertebral body height. This is seen on prior exam dating
back to [DATE].

Conus medullaris and cauda equina: Conus extends to the L1-2 level.
Conus and cauda equina appear normal.

Paraspinal and other soft tissues: The paraspinal soft tissues and
visualized retroperitoneal structures are unremarkable. The
sacroiliac joints are intact.

Disc levels:

T12-L1: There is a broad-based disc bulge and facet arthrosis which
causes mild bilateral neural foraminal narrowing.

L1-L2: There is a broad-based disc bulge with facet arthrosis which
causes mild right neural foraminal narrowing.

L2-L3: There is a broad-based disc bulge with facet arthrosis and
ligamentum flavum hypertrophy. There is moderate bilateral neural
foraminal narrowing. The central thecal sac measures 9 mm in AP
diameter.

L3-L4: There is a broad-based disc bulge with facet arthrosis and
ligamentum flavum hypertrophy. There is moderate bilateral neural
foraminal narrowing. There is mild effacement anterior thecal sac

L4-L5: There is a broad-based disc bulge with disc uncovering, facet
arthrosis and ligamentum flavum hypertrophy. Moderate to severe
bilateral neural foraminal narrowing is seen. The central thecal sac
is mildly effaced measuring 9 mm in AP diameter.

L5-S1: There is a broad-based disc bulge with facet arthrosis and
ligamentum flavum hypertrophy. There is mild bilateral neural
foraminal narrowing.
IMPRESSION: Grade 1 anterolisthesis of L4 on L5.

Chronic superior compression deformity of L2, not changed since
prior exam of 3599

Lumbar spine spondylosis most notable at L4-L5 with moderate to
severe bilateral neural foraminal narrowing and mild central canal
stenosis.

## 2020-03-20 ENCOUNTER — Other Ambulatory Visit: Payer: Self-pay | Admitting: Internal Medicine

## 2020-03-20 ENCOUNTER — Other Ambulatory Visit: Payer: Self-pay

## 2020-03-20 ENCOUNTER — Ambulatory Visit
Admission: RE | Admit: 2020-03-20 | Discharge: 2020-03-20 | Disposition: A | Discharge status: Home / Self Care | Payer: Medicare Other | Source: Ambulatory Visit | Attending: Internal Medicine | Admitting: Internal Medicine

## 2020-03-20 DIAGNOSIS — M5489 Other dorsalgia: Secondary | ICD-10-CM | POA: Diagnosis not present

## 2020-03-20 DIAGNOSIS — G44309 Post-traumatic headache, unspecified, not intractable: Secondary | ICD-10-CM | POA: Diagnosis not present

## 2020-03-20 DIAGNOSIS — S0093XA Contusion of unspecified part of head, initial encounter: Secondary | ICD-10-CM

## 2020-03-20 DIAGNOSIS — W01198A Fall on same level from slipping, tripping and stumbling with subsequent striking against other object, initial encounter: Secondary | ICD-10-CM | POA: Diagnosis not present

## 2020-03-20 DIAGNOSIS — S0990XA Unspecified injury of head, initial encounter: Secondary | ICD-10-CM | POA: Diagnosis not present

## 2020-04-21 DIAGNOSIS — E669 Obesity, unspecified: Secondary | ICD-10-CM | POA: Diagnosis not present

## 2020-04-21 DIAGNOSIS — Z6834 Body mass index (BMI) 34.0-34.9, adult: Secondary | ICD-10-CM | POA: Diagnosis not present

## 2020-04-21 DIAGNOSIS — M1A09X0 Idiopathic chronic gout, multiple sites, without tophus (tophi): Secondary | ICD-10-CM | POA: Diagnosis not present

## 2020-04-21 DIAGNOSIS — M7989 Other specified soft tissue disorders: Secondary | ICD-10-CM | POA: Diagnosis not present

## 2020-04-21 DIAGNOSIS — M150 Primary generalized (osteo)arthritis: Secondary | ICD-10-CM | POA: Diagnosis not present

## 2020-04-21 DIAGNOSIS — Z862 Personal history of diseases of the blood and blood-forming organs and certain disorders involving the immune mechanism: Secondary | ICD-10-CM | POA: Diagnosis not present

## 2020-04-21 DIAGNOSIS — M1990 Unspecified osteoarthritis, unspecified site: Secondary | ICD-10-CM | POA: Diagnosis not present

## 2020-05-07 DIAGNOSIS — K900 Celiac disease: Secondary | ICD-10-CM | POA: Diagnosis not present

## 2020-05-07 DIAGNOSIS — Z86010 Personal history of colonic polyps: Secondary | ICD-10-CM | POA: Diagnosis not present

## 2020-05-07 DIAGNOSIS — R194 Change in bowel habit: Secondary | ICD-10-CM | POA: Diagnosis not present

## 2020-05-07 DIAGNOSIS — K5730 Diverticulosis of large intestine without perforation or abscess without bleeding: Secondary | ICD-10-CM | POA: Diagnosis not present

## 2020-05-13 ENCOUNTER — Ambulatory Visit: Payer: Medicare Other | Admitting: Podiatry

## 2020-05-13 DIAGNOSIS — M109 Gout, unspecified: Secondary | ICD-10-CM | POA: Diagnosis not present

## 2020-05-13 DIAGNOSIS — E039 Hypothyroidism, unspecified: Secondary | ICD-10-CM | POA: Diagnosis not present

## 2020-05-13 DIAGNOSIS — J309 Allergic rhinitis, unspecified: Secondary | ICD-10-CM | POA: Diagnosis not present

## 2020-05-13 DIAGNOSIS — K900 Celiac disease: Secondary | ICD-10-CM | POA: Diagnosis not present

## 2020-05-13 DIAGNOSIS — E785 Hyperlipidemia, unspecified: Secondary | ICD-10-CM | POA: Diagnosis not present

## 2020-05-13 DIAGNOSIS — M1380 Other specified arthritis, unspecified site: Secondary | ICD-10-CM | POA: Diagnosis not present

## 2020-05-15 ENCOUNTER — Ambulatory Visit (INDEPENDENT_AMBULATORY_CARE_PROVIDER_SITE_OTHER): Payer: Medicare Other | Admitting: Podiatry

## 2020-05-15 ENCOUNTER — Other Ambulatory Visit: Payer: Self-pay

## 2020-05-15 ENCOUNTER — Encounter: Payer: Self-pay | Admitting: Podiatry

## 2020-05-15 DIAGNOSIS — M79675 Pain in left toe(s): Secondary | ICD-10-CM | POA: Diagnosis not present

## 2020-05-15 DIAGNOSIS — B351 Tinea unguium: Secondary | ICD-10-CM | POA: Diagnosis not present

## 2020-05-15 DIAGNOSIS — M79674 Pain in right toe(s): Secondary | ICD-10-CM

## 2020-05-15 DIAGNOSIS — L600 Ingrowing nail: Secondary | ICD-10-CM

## 2020-05-15 NOTE — Patient Instructions (Addendum)
EPSOM SALT FOOT SOAK INSTRUCTIONS  Shopping List:  A. Plain epsom salt (not scented) B. Neosporin Cream/Ointment or Bacitracin Cream/Ointment C. 1-inch fabric band-aids    1.  Place 1/4 cup of epsom salts in 2 quarts of warm tap water. IF YOU ARE DIABETIC, OR HAVE NEUROPATHY, CHECK THE TEMPERATURE OF THE WATER WITH YOUR ELBOW.  2.  Submerge your foot/feet in the solution and soak for 10-15 minutes.      3.  Next, remove your foot or feet from solution, blot dry the affected area.    4.  Apply antibiotic ointment and cover with fabric band-aid .  5.  This soak should be done once a day for 7 days.   6.  Monitor for any signs/symptoms of infection such as redness, swelling, odor, drainage, increased pain, or non-healing of digit.   7.  Please do not hesitate to call the office and speak to a Nurse or Doctor if you have questions.   8.  If you experience fever, chills, nightsweats, nausea or vomiting with worsening of digit, please go to the emergency room.   Onychomycosis/Fungal Toenails  WHAT IS IT? An infection that lies within the keratin of your nail plate that is caused by a fungus.  WHY ME? Fungal infections affect all ages, sexes, races, and creeds.  There may be many factors that predispose you to a fungal infection such as age, coexisting medical conditions such as diabetes, or an autoimmune disease; stress, medications, fatigue, genetics, etc.  Bottom line: fungus thrives in a warm, moist environment and your shoes offer such a location.  IS IT CONTAGIOUS? Theoretically, yes.  You do not want to share shoes, nail clippers or files with someone who has fungal toenails.  Walking around barefoot in the same room or sleeping in the same bed is unlikely to transfer the organism.  It is important to realize, however, that fungus can spread easily from one nail to the next on the same foot.  HOW DO WE TREAT THIS?  There are several ways to treat this condition.  Treatment may  depend on many factors such as age, medications, pregnancy, liver and kidney conditions, etc.  It is best to ask your doctor which options are available to you.  1. No treatment.   Unlike many other medical concerns, you can live with this condition.  However for many people this can be a painful condition and may lead to ingrown toenails or a bacterial infection.  It is recommended that you keep the nails cut short to help reduce the amount of fungal nail. 2. Topical treatment.  These range from herbal remedies to prescription strength nail lacquers.  About 40-50% effective, topicals require twice daily application for approximately 9 to 12 months or until an entirely new nail has grown out.  The most effective topicals are medical grade medications available through physicians offices. 3. Oral antifungal medications.  With an 80-90% cure rate, the most common oral medication requires 3 to 4 months of therapy and stays in your system for a year as the new nail grows out.  Oral antifungal medications do require blood work to make sure it is a safe drug for you.  A liver function panel will be performed prior to starting the medication and after the first month of treatment.  It is important to have the blood work performed to avoid any harmful side effects.  In general, this medication safe but blood work is required. 4. Laser Therapy.  This treatment is performed by applying a specialized laser to the affected nail plate.  This therapy is noninvasive, fast, and non-painful.  It is not covered by insurance and is therefore, out of pocket.  The results have been very good with a 80-95% cure rate.  [facility] is the only practice in the area to offer this therapy. 5. Permanent Nail Avulsion.  Removing the entire nail so that a new nail will not grow back.

## 2020-05-19 ENCOUNTER — Telehealth: Payer: Self-pay | Admitting: *Deleted

## 2020-05-19 NOTE — Telephone Encounter (Signed)
Pt states she an her husband were seen by Dr. Elisha Ponder 05/15/2020 and she received paperwork with her name and instructions on toenail fungus, and she did not think she has fungus, and her medication list is not up to date.

## 2020-05-22 NOTE — Progress Notes (Signed)
Subjective: Jessica Esparza is a 79 y.o. female patient seen today painful mycotic nails b/l that are difficult to trim. Pain interferes with ambulation. Aggravating factors include wearing enclosed shoe gear. Pain is relieved with periodic professional debridement  Patient Active Problem List   Diagnosis Date Noted  . Asthmatic bronchitis 07/20/2017  . Epistaxis 10/10/2016    Current Outpatient Medications on File Prior to Visit  Medication Sig Dispense Refill  . albuterol (PROVENTIL HFA;VENTOLIN HFA) 108 (90 Base) MCG/ACT inhaler Inhale 2 puffs into the lungs every 6 (six) hours as needed.     Marland Kitchen allopurinol (ZYLOPRIM) 100 MG tablet Take 100 mg by mouth daily.    Marland Kitchen ALPRAZolam (XANAX) 0.5 MG tablet alprazolam 0.5 mg tablet    . Azelaic Acid 15 % cream Apply 1 application topically 2 (two) times daily.    . cetirizine (ZYRTEC) 10 MG tablet Take 10 mg by mouth.    . clobetasol cream (TEMOVATE) 0.05 % Apply topically.    . colchicine 0.6 MG tablet Take 0.6 mg by mouth daily as needed.     . conjugated estrogens (PREMARIN) vaginal cream Place vaginally.    Marland Kitchen estrogens, conjugated, (PREMARIN) 0.45 MG tablet Take by mouth.    . famotidine (PEPCID) 20 MG tablet Take 20 mg by mouth.    . fluticasone (FLONASE) 50 MCG/ACT nasal spray     . Fluticasone Propionate (FLONASE NA) Place into the nose 2 (two) times daily.    . hydroxychloroquine (PLAQUENIL) 200 MG tablet Take 200 mg by mouth 2 (two) times daily.    . Levothyroxine Sodium (SYNTHROID PO) Take 150 mcg by mouth daily.     Marland Kitchen lidocaine (LIDODERM) 5 % Place 1 patch onto the skin daily. Remove & Discard patch within 12 hours or as directed by MD 30 patch 0  . losartan (COZAAR) 50 MG tablet losartan 50 mg tablet    . mometasone (ASMANEX 60 METERED DOSES) 220 MCG/INH inhaler Inhale 2 puffs into the lungs daily.    . predniSONE (DELTASONE) 5 MG tablet     . simvastatin (ZOCOR) 20 MG tablet Take 20 mg by mouth daily.    . sucralfate (CARAFATE) 1  g tablet Take 1 g by mouth 4 (four) times daily -  with meals and at bedtime.    . SYMBICORT 160-4.5 MCG/ACT inhaler     . SYNTHROID 150 MCG tablet Take 150 mcg by mouth daily.    Marland Kitchen Zoster Vaccine Adjuvanted [facility]) injection Shingrix (PF) 50 mcg/0.5 mL intramuscular suspension, kit    . HYDROcodone-acetaminophen (NORCO/VICODIN) 5-325 MG tablet Take 1 tablet by mouth every 6 (six) hours as needed for severe pain. 15 tablet 0   No current facility-administered medications on file prior to visit.    Allergies  Allergen Reactions  . Nsaids Shortness Of Breath    Wheezing  . Aspirin Other (See Comments)    Other  . Cefdinir     Reported on June 2018  . Esomeprazole Magnesium Nausea And Vomiting    Most meds for GERD  . Penicillins Hives    Objective: Physical Exam  General: 79 y.o. Caucasian female no acute distress, awake, alert and oriented x 3  Neurovascular status unchanged b/l.  Neurovascular status unchanged b/l. Capillary refill time to digits immediate b/l. Palpable DP pulses b/l. Palpable PT pulses b/l. Pedal hair sparse b/l. Skin temperature gradient within normal limits b/l. No edema noted b/l.  Protective sensation intact 5/5 intact bilaterally with 10g monofilament b/l. Vibratory  sensation intact b/l.  Dermatological:  Pedal skin with normal turgor, texture and tone bilaterally. No open wounds bilaterally. No interdigital macerations bilaterally. Toenails 1-5 b/l elongated, dystrophic, thickened, crumbly with subungual debris and tenderness to dorsal palpation.   Incurvated nailplate right great toe medial border(s) with tenderness to palpation. No erythema, no edema, no drainage noted. Musculoskeletal:  Normal muscle strength 5/5 to all lower extremity muscle groups bilaterally. No pain crepitus or joint limitation noted with ROM b/l. Hallux valgus with bunion deformity noted b/l. Hammertoes noted to the L 5th toe.  Assessment and Plan:  No diagnosis  found.  -Examined patient. -Toenails 1-5 b/l were debrided in length and girth with sterile nail nippers and dremel without iatrogenic bleeding.  -Offending nail border debrided and curretaged right hallux. Border cleansed with alcohol and triple antibiotic applied. No further treatment required by patient/caregiver.Patient given written instructions for epsom salt soaks once daily for one week. Call office if condition does not resolve. -Patient to continue soft, supportive shoe gear daily. -Patient to report any pedal injuries to medical professional immediately. -Patient/POA to call should there be question/concern in the interim.  Return in about 3 months (around 08/15/2020), or nail trim.  Marzetta Board, DPM

## 2020-07-24 ENCOUNTER — Other Ambulatory Visit: Payer: Self-pay

## 2020-07-24 ENCOUNTER — Ambulatory Visit (INDEPENDENT_AMBULATORY_CARE_PROVIDER_SITE_OTHER): Payer: Medicare Other | Admitting: Podiatry

## 2020-07-24 ENCOUNTER — Encounter: Payer: Self-pay | Admitting: Podiatry

## 2020-07-24 DIAGNOSIS — M79675 Pain in left toe(s): Secondary | ICD-10-CM

## 2020-07-24 DIAGNOSIS — B351 Tinea unguium: Secondary | ICD-10-CM

## 2020-07-24 DIAGNOSIS — M79674 Pain in right toe(s): Secondary | ICD-10-CM

## 2020-07-25 NOTE — Progress Notes (Signed)
Subjective: Jessica Esparza is a 79 y.o. female patient seen today painful mycotic nails b/l that are difficult to trim. Pain interferes with ambulation. Aggravating factors include wearing enclosed shoe gear. Pain is relieved with periodic professional debridement   Patient states her right great toe feels better.  She states that the border is growing out now and she has no pain on today's visit.  Patient Active Problem List   Diagnosis Date Noted   Asthmatic bronchitis 07/20/2017   Epistaxis 10/10/2016    Current Outpatient Medications on File Prior to Visit  Medication Sig Dispense Refill   albuterol (PROVENTIL HFA;VENTOLIN HFA) 108 (90 Base) MCG/ACT inhaler Inhale 2 puffs into the lungs every 6 (six) hours as needed.      allopurinol (ZYLOPRIM) 100 MG tablet Take 100 mg by mouth daily.     ALPRAZolam (XANAX) 0.5 MG tablet alprazolam 0.5 mg tablet     Azelaic Acid 15 % cream Apply 1 application topically 2 (two) times daily.     cetirizine (ZYRTEC) 10 MG tablet Take 10 mg by mouth.     clobetasol cream (TEMOVATE) 0.05 % Apply topically.     colchicine 0.6 MG tablet Take 0.6 mg by mouth daily as needed.      conjugated estrogens (PREMARIN) vaginal cream Place vaginally.     estrogens, conjugated, (PREMARIN) 0.45 MG tablet Take by mouth.     famotidine (PEPCID) 20 MG tablet Take 20 mg by mouth.     fluticasone (FLONASE) 50 MCG/ACT nasal spray      Fluticasone Propionate (FLONASE NA) Place into the nose 2 (two) times daily.     HYDROcodone-acetaminophen (NORCO/VICODIN) 5-325 MG tablet Take 1 tablet by mouth every 6 (six) hours as needed for severe pain. 15 tablet 0   hydroxychloroquine (PLAQUENIL) 200 MG tablet Take 200 mg by mouth 2 (two) times daily.     Levothyroxine Sodium (SYNTHROID PO) Take 150 mcg by mouth daily.      lidocaine (LIDODERM) 5 % Place 1 patch onto the skin daily. Remove & Discard patch within 12 hours or as directed by MD 30 patch 0   losartan  (COZAAR) 50 MG tablet losartan 50 mg tablet     mometasone (ASMANEX 60 METERED DOSES) 220 MCG/INH inhaler Inhale 2 puffs into the lungs daily.     predniSONE (DELTASONE) 5 MG tablet      simvastatin (ZOCOR) 20 MG tablet Take 20 mg by mouth daily.     sucralfate (CARAFATE) 1 g tablet Take 1 g by mouth 4 (four) times daily -  with meals and at bedtime.     SYMBICORT 160-4.5 MCG/ACT inhaler      SYNTHROID 150 MCG tablet Take 150 mcg by mouth daily.     Zoster Vaccine Adjuvanted [facility]) injection Shingrix (PF) 50 mcg/0.5 mL intramuscular suspension, kit     No current facility-administered medications on file prior to visit.    Allergies  Allergen Reactions   Nsaids Shortness Of Breath    Wheezing   Aspirin Other (See Comments)    Other   Cefdinir     Reported on June 2018   Esomeprazole Magnesium Nausea And Vomiting    Most meds for GERD   Penicillins Hives    Objective: Physical Exam  General: Jessica Esparza is a pleasant 79 y.o. Caucasian female no acute distress. Awake, alert and oriented x 3.  Neurovascular status unchanged b/l.  Neurovascular status unchanged b/l. Capillary refill time to digits immediate b/l. Palpable DP  pulses b/l. Palpable PT pulses b/l. Pedal hair sparse b/l. Skin temperature gradient within normal limits b/l. No edema noted b/l.  Protective sensation intact 5/5 intact bilaterally with 10g monofilament b/l. Vibratory sensation intact b/l.  Dermatological:  Pedal skin with normal turgor, texture and tone bilaterally. No open wounds bilaterally. No interdigital macerations bilaterally. Toenails 1-5 b/l elongated, dystrophic, thickened, crumbly with subungual debris and tenderness to dorsal palpation.   Incurvated nailplate right great toe medial border(s) with tenderness to palpation. No erythema, no edema, no drainage noted. Musculoskeletal:  Normal muscle strength 5/5 to all lower extremity muscle groups bilaterally. No pain crepitus or  joint limitation noted with ROM b/l. Hallux valgus with bunion deformity noted b/l. Hammertoes noted to the L 5th toe.  Assessment and Plan:  1. Pain due to onychomycosis of toenails of both feet    -Examined patient. -Toenails 1-5 b/l were debrided in length and girth with sterile nail nippers and dremel without iatrogenic bleeding.  -She did ask if she could get a pedicure.  I informed her I would hold off until the entire nail plate of the right great toe grows back.  She related understanding. -Patient to continue soft, supportive shoe gear daily. -Patient to report any pedal injuries to medical professional immediately. -Patient/POA to call should there be question/concern in the interim.  Return in about 9 weeks (around 09/25/2020) for nail trim.  Marzetta Board, DPM

## 2020-07-30 DIAGNOSIS — H02835 Dermatochalasis of left lower eyelid: Secondary | ICD-10-CM | POA: Diagnosis not present

## 2020-07-30 DIAGNOSIS — I10 Essential (primary) hypertension: Secondary | ICD-10-CM | POA: Insufficient documentation

## 2020-07-30 DIAGNOSIS — H02423 Myogenic ptosis of bilateral eyelids: Secondary | ICD-10-CM | POA: Diagnosis not present

## 2020-07-30 DIAGNOSIS — K900 Celiac disease: Secondary | ICD-10-CM | POA: Insufficient documentation

## 2020-07-30 DIAGNOSIS — E7800 Pure hypercholesterolemia, unspecified: Secondary | ICD-10-CM | POA: Insufficient documentation

## 2020-07-30 DIAGNOSIS — M810 Age-related osteoporosis without current pathological fracture: Secondary | ICD-10-CM | POA: Insufficient documentation

## 2020-07-30 DIAGNOSIS — H02831 Dermatochalasis of right upper eyelid: Secondary | ICD-10-CM | POA: Diagnosis not present

## 2020-07-30 DIAGNOSIS — H57813 Brow ptosis, bilateral: Secondary | ICD-10-CM | POA: Diagnosis not present

## 2020-07-30 DIAGNOSIS — L719 Rosacea, unspecified: Secondary | ICD-10-CM | POA: Insufficient documentation

## 2020-07-30 DIAGNOSIS — H02834 Dermatochalasis of left upper eyelid: Secondary | ICD-10-CM | POA: Diagnosis not present

## 2020-07-30 DIAGNOSIS — E039 Hypothyroidism, unspecified: Secondary | ICD-10-CM | POA: Insufficient documentation

## 2020-07-30 DIAGNOSIS — H02413 Mechanical ptosis of bilateral eyelids: Secondary | ICD-10-CM | POA: Diagnosis not present

## 2020-07-30 DIAGNOSIS — H02832 Dermatochalasis of right lower eyelid: Secondary | ICD-10-CM | POA: Diagnosis not present

## 2020-07-30 HISTORY — DX: Essential (primary) hypertension: I10

## 2020-08-18 DIAGNOSIS — L03019 Cellulitis of unspecified finger: Secondary | ICD-10-CM | POA: Insufficient documentation

## 2020-08-18 DIAGNOSIS — M79644 Pain in right finger(s): Secondary | ICD-10-CM | POA: Diagnosis not present

## 2020-08-24 DIAGNOSIS — M150 Primary generalized (osteo)arthritis: Secondary | ICD-10-CM | POA: Diagnosis not present

## 2020-08-24 DIAGNOSIS — E669 Obesity, unspecified: Secondary | ICD-10-CM | POA: Diagnosis not present

## 2020-08-24 DIAGNOSIS — M1990 Unspecified osteoarthritis, unspecified site: Secondary | ICD-10-CM | POA: Diagnosis not present

## 2020-08-24 DIAGNOSIS — Z862 Personal history of diseases of the blood and blood-forming organs and certain disorders involving the immune mechanism: Secondary | ICD-10-CM | POA: Diagnosis not present

## 2020-08-24 DIAGNOSIS — M1A09X0 Idiopathic chronic gout, multiple sites, without tophus (tophi): Secondary | ICD-10-CM | POA: Diagnosis not present

## 2020-08-24 DIAGNOSIS — Z6833 Body mass index (BMI) 33.0-33.9, adult: Secondary | ICD-10-CM | POA: Diagnosis not present

## 2020-09-15 IMAGING — CT CT HEAD W/O CM
4 series · 17 of 47 positions shown, 19 images · non-contrast
Comparison: None.

CLINICAL DATA: Status post fall.

EXAM:
CT HEAD WITHOUT CONTRAST
TECHNIQUE: Contiguous axial images were obtained from the base of the skull
through the vertex without intravenous contrast.

[Series 2: head 5.00 hr40 s3 axial ibhc · axial · 0.42mm/px · z∈[-609,-479]mm · 7 of 36 slices shown, 9 images]
[im 5/36  brain]
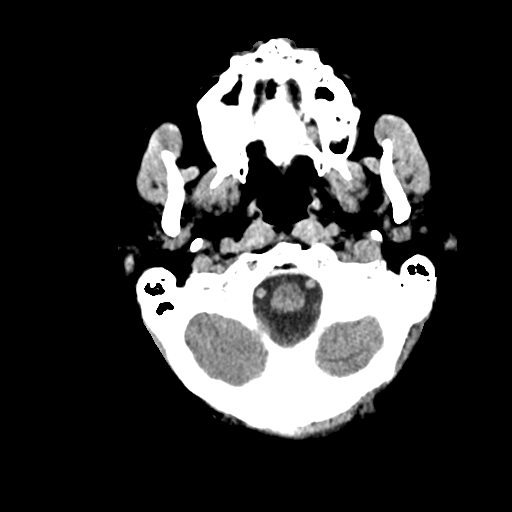
[im 5/36  bone]
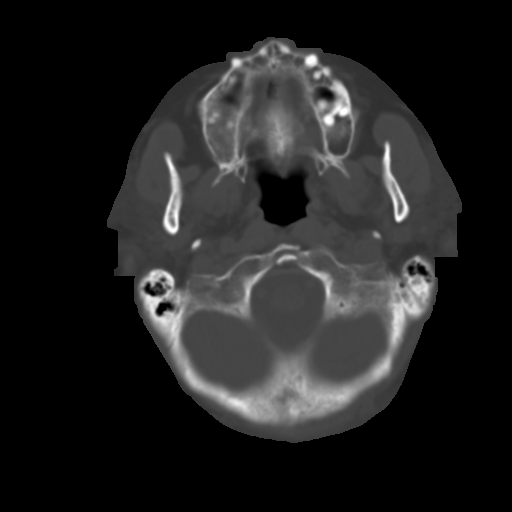
[im 9/36  brain]
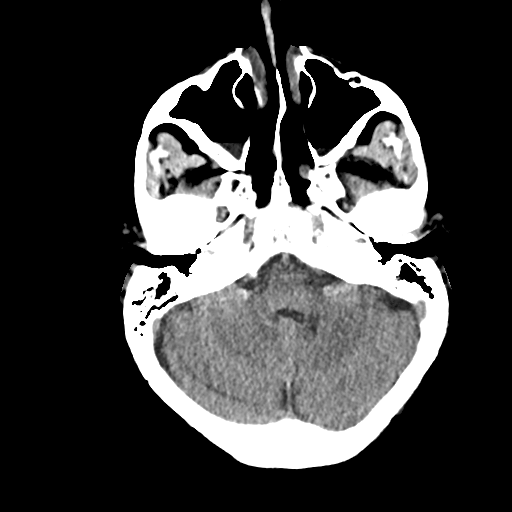
[im 14/36  brain]
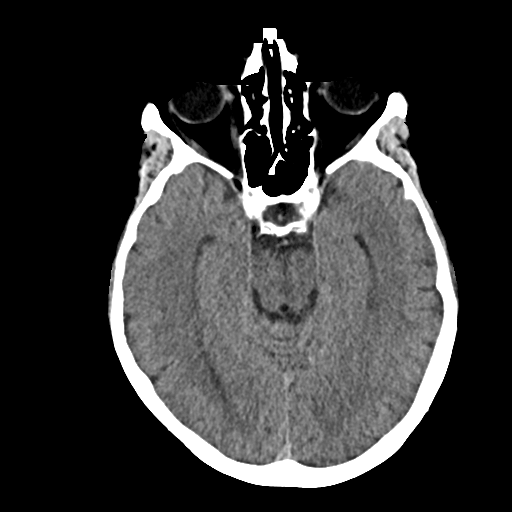
[im 18/36  brain]
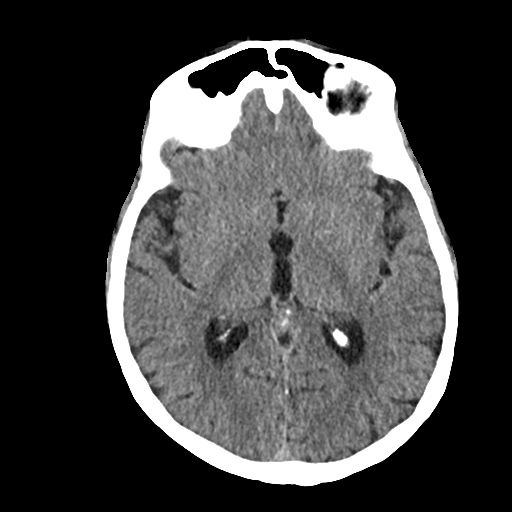
[im 22/36  brain]
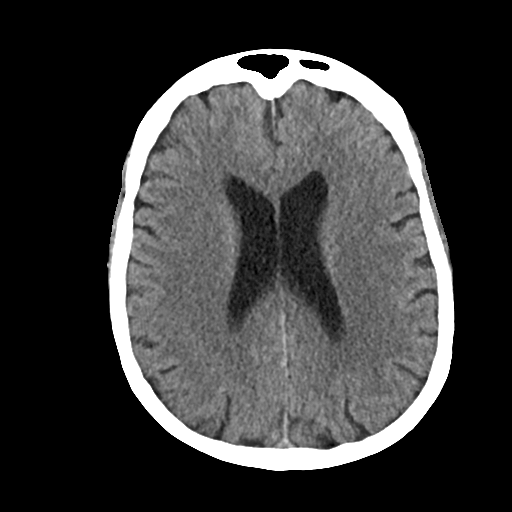
[im 22/36  bone]
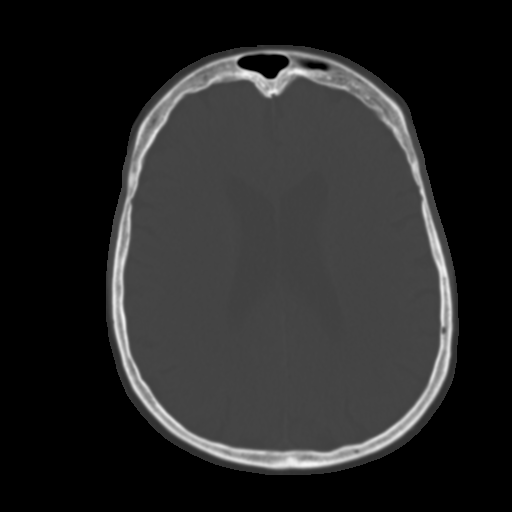
[im 27/36  brain]
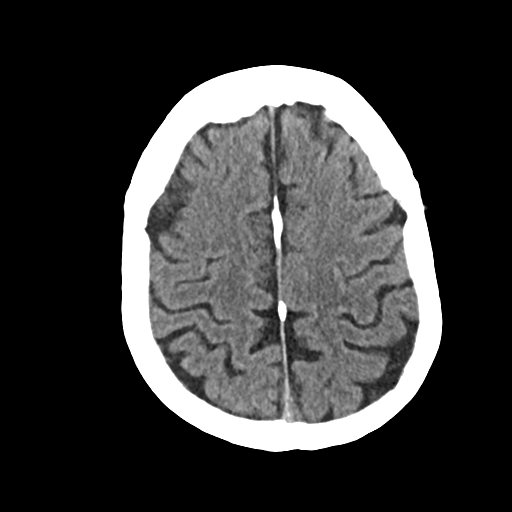
[im 31/36  brain]
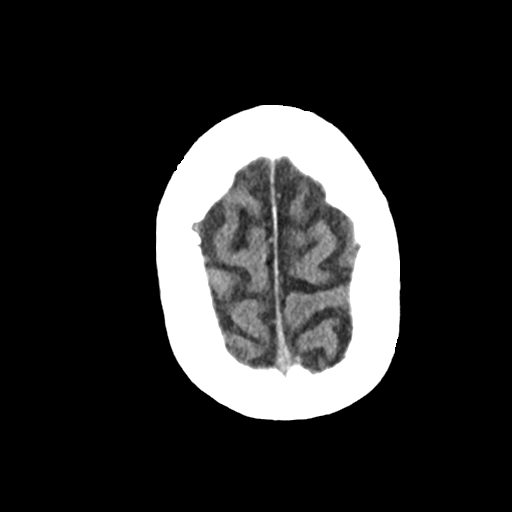

[Series 3: head 2.00 hr60 s3 axial bone · axial · 0.42mm/px · z∈[-613,-551]mm · 4 of 91 slices shown]
[im 10/91  bone]
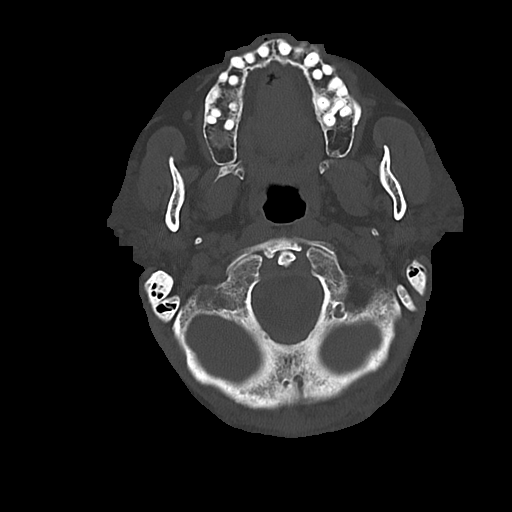
[im 19/91  bone]
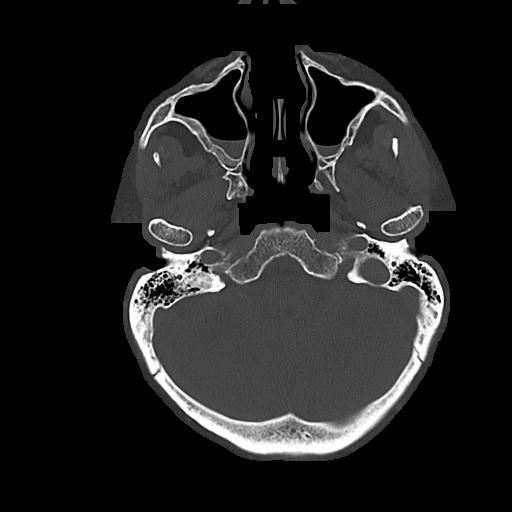
[im 28/91  bone]
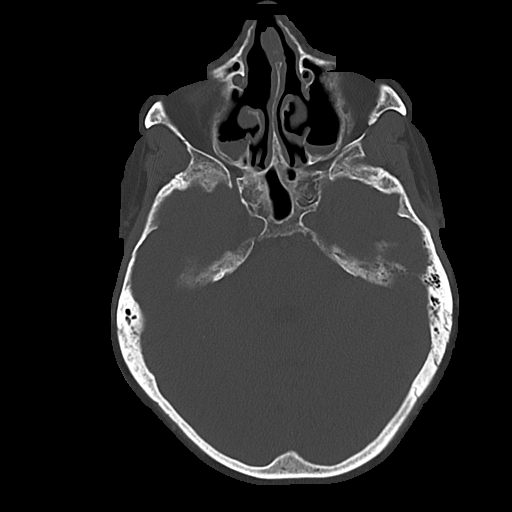
[im 41/91  bone]
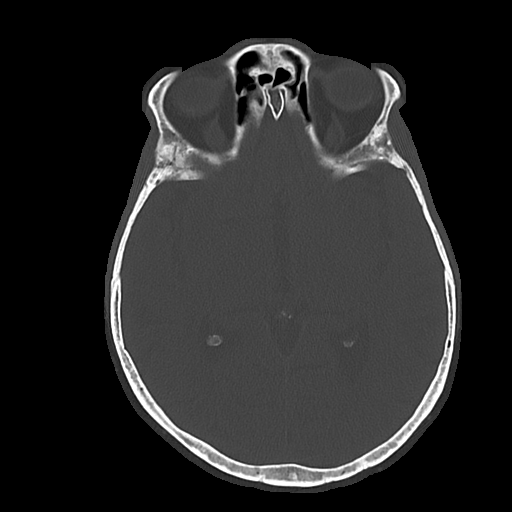

[Series 4: head 3.00 hr40 s3 sag · sagittal · 0.36mm/px · 3 of 90 slices shown]
[im 30/90  brain]
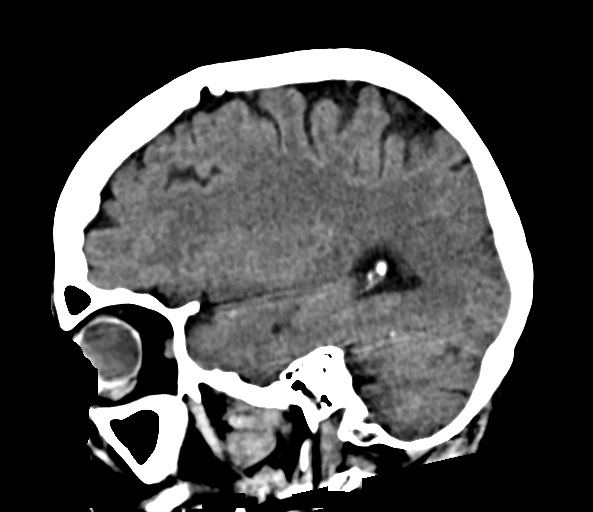
[im 45/90  brain]
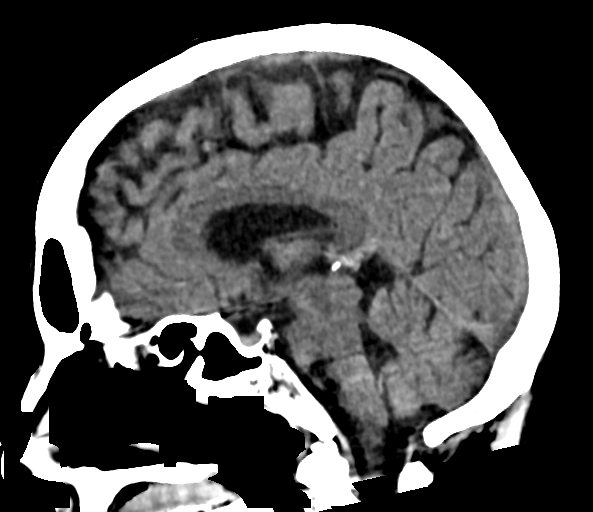
[im 60/90  brain]
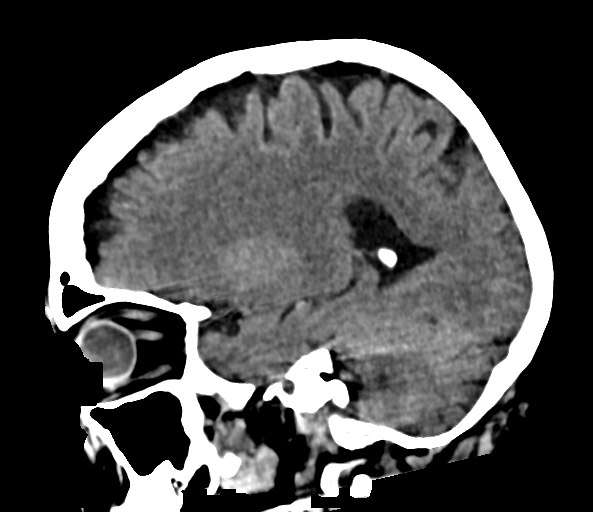

[Series 6: head 3.00 hr40 s3 cor · coronal · 0.36mm/px · 3 of 106 slices shown]
[im 36/106  brain]
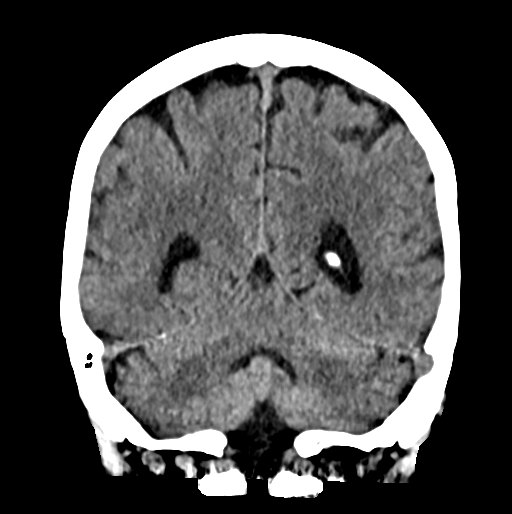
[im 47/106  brain]
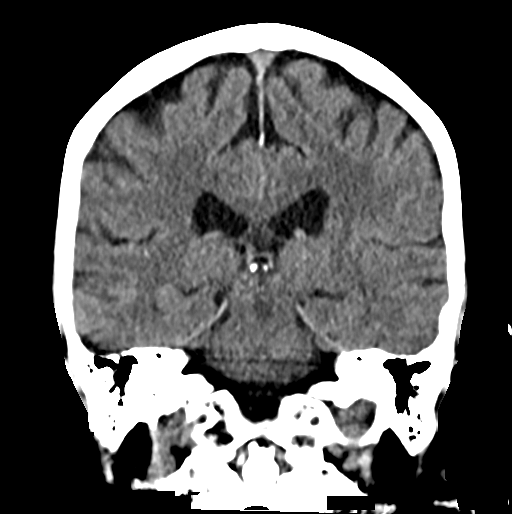
[im 59/106  brain]
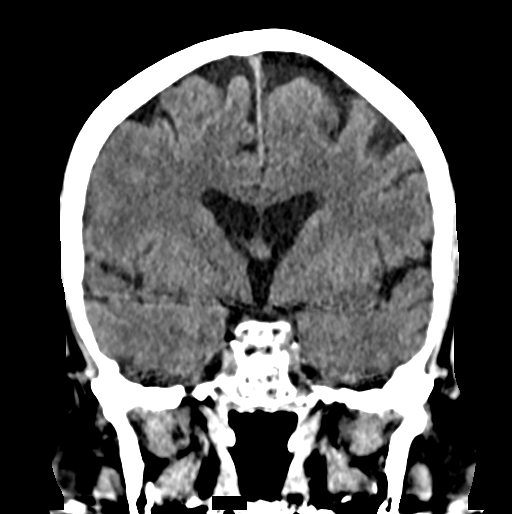

[17 of 47 positions shown; findings below may reference images not displayed]

FINDINGS: Brain: No evidence of acute infarction, hemorrhage, hydrocephalus,
extra-axial collection or mass lesion/mass effect. Mild chronic
diffuse atrophy is noted.

Vascular: No hyperdense vessel is noted.

Skull: No acute or focal abnormality.

Sinuses/Orbits: Small air-fluid levels are identified in bilateral
maxillary sinuses.

Other: None.
IMPRESSION: 1. No focal acute intracranial abnormality identified.
2. Small air-fluid levels are identified in bilateral maxillary
sinuses, question sinusitis.

## 2020-09-17 DIAGNOSIS — Z961 Presence of intraocular lens: Secondary | ICD-10-CM | POA: Diagnosis not present

## 2020-09-17 DIAGNOSIS — H04123 Dry eye syndrome of bilateral lacrimal glands: Secondary | ICD-10-CM | POA: Diagnosis not present

## 2020-09-17 DIAGNOSIS — H5213 Myopia, bilateral: Secondary | ICD-10-CM | POA: Diagnosis not present

## 2020-09-26 DIAGNOSIS — Z23 Encounter for immunization: Secondary | ICD-10-CM | POA: Diagnosis not present

## 2020-10-20 ENCOUNTER — Ambulatory Visit: Payer: Medicare Other | Admitting: Podiatry

## 2020-10-30 ENCOUNTER — Ambulatory Visit: Payer: Medicare Other | Admitting: Podiatry

## 2020-11-16 DIAGNOSIS — M109 Gout, unspecified: Secondary | ICD-10-CM | POA: Diagnosis not present

## 2020-11-16 DIAGNOSIS — E785 Hyperlipidemia, unspecified: Secondary | ICD-10-CM | POA: Diagnosis not present

## 2020-11-16 DIAGNOSIS — E039 Hypothyroidism, unspecified: Secondary | ICD-10-CM | POA: Diagnosis not present

## 2020-11-17 DIAGNOSIS — Z1231 Encounter for screening mammogram for malignant neoplasm of breast: Secondary | ICD-10-CM | POA: Diagnosis not present

## 2020-11-23 DIAGNOSIS — I8390 Asymptomatic varicose veins of unspecified lower extremity: Secondary | ICD-10-CM | POA: Diagnosis not present

## 2020-11-23 DIAGNOSIS — R82998 Other abnormal findings in urine: Secondary | ICD-10-CM | POA: Diagnosis not present

## 2020-11-23 DIAGNOSIS — M5136 Other intervertebral disc degeneration, lumbar region: Secondary | ICD-10-CM | POA: Diagnosis not present

## 2020-11-23 DIAGNOSIS — Z1331 Encounter for screening for depression: Secondary | ICD-10-CM | POA: Diagnosis not present

## 2020-11-23 DIAGNOSIS — K900 Celiac disease: Secondary | ICD-10-CM | POA: Diagnosis not present

## 2020-11-23 DIAGNOSIS — N1831 Chronic kidney disease, stage 3a: Secondary | ICD-10-CM | POA: Diagnosis not present

## 2020-11-23 DIAGNOSIS — I708 Atherosclerosis of other arteries: Secondary | ICD-10-CM | POA: Diagnosis not present

## 2020-11-23 DIAGNOSIS — Z0000 Encounter for general adult medical examination without abnormal findings: Secondary | ICD-10-CM | POA: Diagnosis not present

## 2020-11-23 DIAGNOSIS — E039 Hypothyroidism, unspecified: Secondary | ICD-10-CM | POA: Diagnosis not present

## 2020-11-23 DIAGNOSIS — E663 Overweight: Secondary | ICD-10-CM | POA: Diagnosis not present

## 2020-11-23 DIAGNOSIS — E785 Hyperlipidemia, unspecified: Secondary | ICD-10-CM | POA: Diagnosis not present

## 2020-11-23 DIAGNOSIS — J309 Allergic rhinitis, unspecified: Secondary | ICD-10-CM | POA: Diagnosis not present

## 2020-11-23 DIAGNOSIS — M109 Gout, unspecified: Secondary | ICD-10-CM | POA: Diagnosis not present

## 2020-12-01 ENCOUNTER — Ambulatory Visit (INDEPENDENT_AMBULATORY_CARE_PROVIDER_SITE_OTHER): Payer: Medicare Other | Admitting: Podiatry

## 2020-12-01 ENCOUNTER — Other Ambulatory Visit: Payer: Self-pay

## 2020-12-01 DIAGNOSIS — M79674 Pain in right toe(s): Secondary | ICD-10-CM

## 2020-12-01 DIAGNOSIS — B351 Tinea unguium: Secondary | ICD-10-CM | POA: Diagnosis not present

## 2020-12-01 DIAGNOSIS — M79675 Pain in left toe(s): Secondary | ICD-10-CM | POA: Diagnosis not present

## 2020-12-04 NOTE — Progress Notes (Signed)
  Subjective:  Patient ID: Jessica Esparza, female    DOB: July 07, 1941,  MRN: [id number]  Chief Complaint  Patient presents with  . routine foot care    nail trim     79 y.o. female presents with the above complaint. History confirmed with patient.  She normally sees Dr. Elisha Ponder.  Debridements have been helpful.  Nails are elongated thickened again.  Objective:  Physical Exam: warm, good capillary refill, no trophic changes or ulcerative lesions, normal DP and PT pulses and normal sensory exam.  Thickened elongated dystrophic toenails x10 with subungual debris and discoloration  Assessment:   1. Pain due to onychomycosis of toenails of both feet      Plan:  Patient was evaluated and treated and all questions answered.  Discussed the etiology and treatment options for the condition in detail with the patient. Educated patient on the topical and oral treatment options for mycotic nails. Recommended debridement of the nails today. Sharp and mechanical debridement performed of all painful and mycotic nails today. Nails debrided in length and thickness using a nail nipper and a mechanical burr to level of comfort. Discussed treatment options including appropriate shoe gear. Follow up as needed for painful nails.    Return in about 10 weeks (around 02/09/2021).

## 2020-12-08 DIAGNOSIS — Z1212 Encounter for screening for malignant neoplasm of rectum: Secondary | ICD-10-CM | POA: Diagnosis not present

## 2020-12-10 DIAGNOSIS — M5136 Other intervertebral disc degeneration, lumbar region: Secondary | ICD-10-CM | POA: Diagnosis not present

## 2020-12-10 DIAGNOSIS — M5010 Cervical disc disorder with radiculopathy, unspecified cervical region: Secondary | ICD-10-CM | POA: Diagnosis not present

## 2020-12-10 DIAGNOSIS — M810 Age-related osteoporosis without current pathological fracture: Secondary | ICD-10-CM | POA: Diagnosis not present

## 2021-01-12 ENCOUNTER — Other Ambulatory Visit (HOSPITAL_COMMUNITY): Payer: Self-pay | Admitting: *Deleted

## 2021-01-19 ENCOUNTER — Other Ambulatory Visit: Payer: Self-pay

## 2021-01-19 ENCOUNTER — Ambulatory Visit (HOSPITAL_COMMUNITY)
Admission: RE | Admit: 2021-01-19 | Discharge: 2021-01-19 | Disposition: A | Discharge status: Home / Self Care | Payer: Medicare Other | Source: Ambulatory Visit | Attending: Internal Medicine | Admitting: Internal Medicine

## 2021-01-19 ENCOUNTER — Encounter (HOSPITAL_COMMUNITY): Payer: Medicare Other

## 2021-01-19 DIAGNOSIS — M810 Age-related osteoporosis without current pathological fracture: Secondary | ICD-10-CM | POA: Insufficient documentation

## 2021-01-19 MED ORDER — DENOSUMAB 60 MG/ML ~~LOC~~ SOSY: 60.0000 mg | PREFILLED_SYRINGE | Freq: Once | SUBCUTANEOUS | Status: AC

## 2021-01-19 MED ORDER — DENOSUMAB 60 MG/ML ~~LOC~~ SOSY
PREFILLED_SYRINGE | SUBCUTANEOUS | Status: AC
  Administered 2021-01-19: 60 mg via SUBCUTANEOUS
  Filled 2021-01-19: qty 1

## 2021-01-19 NOTE — Discharge Instructions (Signed)
Denosumab injection What is this medicine? DENOSUMAB (den oh sue mab) slows bone breakdown. Prolia is used to treat osteoporosis in women after menopause and in men, and in people who are taking corticosteroids for 6 months or more. Xgeva is used to treat a high calcium level due to cancer and to prevent bone fractures and other bone problems caused by multiple myeloma or cancer bone metastases. Xgeva is also used to treat giant cell tumor of the bone. This medicine may be used for other purposes; ask your health care provider or pharmacist if you have questions. COMMON BRAND NAME(S): Prolia, XGEVA What should I tell my health care provider before I take this medicine? They need to know if you have any of these conditions:  dental disease  having surgery or tooth extraction  infection  kidney disease  low levels of calcium or Vitamin D in the blood  malnutrition  on hemodialysis  skin conditions or sensitivity  thyroid or parathyroid disease  an unusual reaction to denosumab, other medicines, foods, dyes, or preservatives  pregnant or trying to get pregnant  breast-feeding How should I use this medicine? This medicine is for injection under the skin. It is given by a health care professional in a hospital or clinic setting. A special MedGuide will be given to you before each treatment. Be sure to read this information carefully each time. For Prolia, talk to your pediatrician regarding the use of this medicine in children. Special care may be needed. For Xgeva, talk to your pediatrician regarding the use of this medicine in children. While this drug may be prescribed for children as young as 13 years for selected conditions, precautions do apply. Overdosage: If you think you have taken too much of this medicine contact a poison control center or emergency room at once. NOTE: This medicine is only for you. Do not share this medicine with others. What if I miss a dose? It is  important not to miss your dose. Call your doctor or health care professional if you are unable to keep an appointment. What may interact with this medicine? Do not take this medicine with any of the following medications:  other medicines containing denosumab This medicine may also interact with the following medications:  medicines that lower your chance of fighting infection  steroid medicines like prednisone or cortisone This list may not describe all possible interactions. Give your health care provider a list of all the medicines, herbs, non-prescription drugs, or dietary supplements you use. Also tell them if you smoke, drink alcohol, or use illegal drugs. Some items may interact with your medicine. What should I watch for while using this medicine? Visit your doctor or health care professional for regular checks on your progress. Your doctor or health care professional may order blood tests and other tests to see how you are doing. Call your doctor or health care professional for advice if you get a fever, chills or sore throat, or other symptoms of a cold or flu. Do not treat yourself. This drug may decrease your body's ability to fight infection. Try to avoid being around people who are sick. You should make sure you get enough calcium and vitamin D while you are taking this medicine, unless your doctor tells you not to. Discuss the foods you eat and the vitamins you take with your health care professional. See your dentist regularly. Brush and floss your teeth as directed. Before you have any dental work done, tell your dentist you are   receiving this medicine. Do not become pregnant while taking this medicine or for 5 months after stopping it. Talk with your doctor or health care professional about your birth control options while taking this medicine. Women should inform their doctor if they wish to become pregnant or think they might be pregnant. There is a potential for serious side  effects to an unborn child. Talk to your health care professional or pharmacist for more information. What side effects may I notice from receiving this medicine? Side effects that you should report to your doctor or health care professional as soon as possible:  allergic reactions like skin rash, itching or hives, swelling of the face, lips, or tongue  bone pain  breathing problems  dizziness  jaw pain, especially after dental work  redness, blistering, peeling of the skin  signs and symptoms of infection like fever or chills; cough; sore throat; pain or trouble passing urine  signs of low calcium like fast heartbeat, muscle cramps or muscle pain; pain, tingling, numbness in the hands or feet; seizures  unusual bleeding or bruising  unusually weak or tired Side effects that usually do not require medical attention (report to your doctor or health care professional if they continue or are bothersome):  constipation  diarrhea  headache  joint pain  loss of appetite  muscle pain  runny nose  tiredness  upset stomach This list may not describe all possible side effects. Call your doctor for medical advice about side effects. You may report side effects to FDA at 1-800-FDA-1088. Where should I keep my medicine? This medicine is only given in a clinic, doctor's office, or other health care setting and will not be stored at home. NOTE: This sheet is a summary. It may not cover all possible information. If you have questions about this medicine, talk to your doctor, pharmacist, or health care provider.  2021 Elsevier/Gold Standard (2018-04-20 16:10:44)

## 2021-02-02 ENCOUNTER — Ambulatory Visit: Payer: Medicare Other | Admitting: Podiatry

## 2021-02-02 ENCOUNTER — Ambulatory Visit (INDEPENDENT_AMBULATORY_CARE_PROVIDER_SITE_OTHER): Payer: Medicare Other | Admitting: Podiatry

## 2021-02-02 ENCOUNTER — Other Ambulatory Visit: Payer: Self-pay

## 2021-02-02 DIAGNOSIS — M79674 Pain in right toe(s): Secondary | ICD-10-CM | POA: Diagnosis not present

## 2021-02-02 DIAGNOSIS — M79675 Pain in left toe(s): Secondary | ICD-10-CM | POA: Diagnosis not present

## 2021-02-02 DIAGNOSIS — B351 Tinea unguium: Secondary | ICD-10-CM | POA: Diagnosis not present

## 2021-02-03 ENCOUNTER — Encounter: Payer: Self-pay | Admitting: Podiatry

## 2021-02-03 NOTE — Progress Notes (Signed)
  Subjective:  Patient ID: Jessica Esparza, female    DOB: [birth year],  MRN: [id number]  Chief Complaint  Patient presents with  . routine foot care    Nail trim     80 y.o. female presents with the above complaint. History confirmed with patient.  She normally sees Dr. Elisha Ponder.  Debridements have been helpful.  Nails are elongated thickened again.  Objective:  Physical Exam: warm, good capillary refill, no trophic changes or ulcerative lesions, normal DP and PT pulses and normal sensory exam.  Thickened elongated dystrophic toenails x10 with subungual debris and discoloration  Assessment:   1. Pain due to onychomycosis of toenails of both feet      Plan:  Patient was evaluated and treated and all questions answered.  Discussed the etiology and treatment options for the condition in detail with the patient. Educated patient on the topical and oral treatment options for mycotic nails. Recommended debridement of the nails today. Sharp and mechanical debridement performed of all painful and mycotic nails today. Nails debrided in length and thickness using a nail nipper and a mechanical burr to level of comfort. Discussed treatment options including appropriate shoe gear. Follow up as needed for painful nails.    No follow-ups on file.

## 2021-02-23 DIAGNOSIS — E669 Obesity, unspecified: Secondary | ICD-10-CM | POA: Diagnosis not present

## 2021-02-23 DIAGNOSIS — Z6834 Body mass index (BMI) 34.0-34.9, adult: Secondary | ICD-10-CM | POA: Diagnosis not present

## 2021-02-23 DIAGNOSIS — M1A09X0 Idiopathic chronic gout, multiple sites, without tophus (tophi): Secondary | ICD-10-CM | POA: Diagnosis not present

## 2021-02-23 DIAGNOSIS — Z862 Personal history of diseases of the blood and blood-forming organs and certain disorders involving the immune mechanism: Secondary | ICD-10-CM | POA: Diagnosis not present

## 2021-02-23 DIAGNOSIS — M1990 Unspecified osteoarthritis, unspecified site: Secondary | ICD-10-CM | POA: Diagnosis not present

## 2021-02-23 DIAGNOSIS — M150 Primary generalized (osteo)arthritis: Secondary | ICD-10-CM | POA: Diagnosis not present

## 2021-03-02 DIAGNOSIS — L720 Epidermal cyst: Secondary | ICD-10-CM | POA: Diagnosis not present

## 2021-03-02 DIAGNOSIS — L821 Other seborrheic keratosis: Secondary | ICD-10-CM | POA: Diagnosis not present

## 2021-03-02 DIAGNOSIS — Z85828 Personal history of other malignant neoplasm of skin: Secondary | ICD-10-CM | POA: Diagnosis not present

## 2021-03-02 DIAGNOSIS — L570 Actinic keratosis: Secondary | ICD-10-CM | POA: Diagnosis not present

## 2021-03-02 DIAGNOSIS — D2261 Melanocytic nevi of right upper limb, including shoulder: Secondary | ICD-10-CM | POA: Diagnosis not present

## 2021-03-02 DIAGNOSIS — D692 Other nonthrombocytopenic purpura: Secondary | ICD-10-CM | POA: Diagnosis not present

## 2021-03-15 DIAGNOSIS — E039 Hypothyroidism, unspecified: Secondary | ICD-10-CM | POA: Diagnosis not present

## 2021-03-25 DIAGNOSIS — J309 Allergic rhinitis, unspecified: Secondary | ICD-10-CM | POA: Diagnosis not present

## 2021-03-25 DIAGNOSIS — E785 Hyperlipidemia, unspecified: Secondary | ICD-10-CM | POA: Diagnosis not present

## 2021-03-25 DIAGNOSIS — E039 Hypothyroidism, unspecified: Secondary | ICD-10-CM | POA: Diagnosis not present

## 2021-03-25 DIAGNOSIS — N1831 Chronic kidney disease, stage 3a: Secondary | ICD-10-CM | POA: Diagnosis not present

## 2021-03-25 DIAGNOSIS — M109 Gout, unspecified: Secondary | ICD-10-CM | POA: Diagnosis not present

## 2021-03-25 DIAGNOSIS — M810 Age-related osteoporosis without current pathological fracture: Secondary | ICD-10-CM | POA: Diagnosis not present

## 2021-03-25 DIAGNOSIS — K900 Celiac disease: Secondary | ICD-10-CM | POA: Diagnosis not present

## 2021-03-25 DIAGNOSIS — Z23 Encounter for immunization: Secondary | ICD-10-CM | POA: Diagnosis not present

## 2021-04-06 ENCOUNTER — Ambulatory Visit: Payer: BLUE CROSS/BLUE SHIELD | Admitting: Podiatry

## 2021-04-07 ENCOUNTER — Other Ambulatory Visit: Payer: Self-pay

## 2021-04-07 ENCOUNTER — Ambulatory Visit (INDEPENDENT_AMBULATORY_CARE_PROVIDER_SITE_OTHER): Payer: Medicare Other | Admitting: Podiatry

## 2021-04-07 DIAGNOSIS — M79674 Pain in right toe(s): Secondary | ICD-10-CM

## 2021-04-07 DIAGNOSIS — B351 Tinea unguium: Secondary | ICD-10-CM | POA: Diagnosis not present

## 2021-04-07 DIAGNOSIS — M79675 Pain in left toe(s): Secondary | ICD-10-CM

## 2021-04-08 ENCOUNTER — Encounter: Payer: Self-pay | Admitting: Podiatry

## 2021-04-08 NOTE — Progress Notes (Signed)
  Subjective:  Patient ID: Jessica Esparza, female    DOB: 05-Jul-1941,  MRN: [id number]  Chief Complaint  Patient presents with  . Nail Problem    Nail trim     80 y.o. female presents with the above complaint. History confirmed with patient.  She normally sees Dr. Elisha Ponder.  Debridements have been helpful.  Nails are elongated thickened again.  Objective:  Physical Exam: warm, good capillary refill, no trophic changes or ulcerative lesions, normal DP and PT pulses and normal sensory exam.  Thickened elongated dystrophic toenails x10 with subungual debris and discoloration  Assessment:   1. Pain due to onychomycosis of toenails of both feet      Plan:  Patient was evaluated and treated and all questions answered.  Discussed the etiology and treatment options for the condition in detail with the patient. Educated patient on the topical and oral treatment options for mycotic nails. Recommended debridement of the nails today. Sharp and mechanical debridement performed of all painful and mycotic nails today. Nails debrided in length and thickness using a nail nipper and a mechanical burr to level of comfort. Discussed treatment options including appropriate shoe gear. Follow up as needed for painful nails.    No follow-ups on file.

## 2021-05-19 DIAGNOSIS — N993 Prolapse of vaginal vault after hysterectomy: Secondary | ICD-10-CM | POA: Diagnosis not present

## 2021-05-19 DIAGNOSIS — N3946 Mixed incontinence: Secondary | ICD-10-CM | POA: Diagnosis not present

## 2021-05-25 DIAGNOSIS — R059 Cough, unspecified: Secondary | ICD-10-CM | POA: Diagnosis not present

## 2021-05-25 DIAGNOSIS — Z1152 Encounter for screening for COVID-19: Secondary | ICD-10-CM | POA: Diagnosis not present

## 2021-05-25 DIAGNOSIS — B349 Viral infection, unspecified: Secondary | ICD-10-CM | POA: Diagnosis not present

## 2021-06-01 DIAGNOSIS — J209 Acute bronchitis, unspecified: Secondary | ICD-10-CM | POA: Diagnosis not present

## 2021-06-01 DIAGNOSIS — M791 Myalgia, unspecified site: Secondary | ICD-10-CM | POA: Diagnosis not present

## 2021-06-01 DIAGNOSIS — M810 Age-related osteoporosis without current pathological fracture: Secondary | ICD-10-CM | POA: Diagnosis not present

## 2021-06-01 DIAGNOSIS — N1831 Chronic kidney disease, stage 3a: Secondary | ICD-10-CM | POA: Diagnosis not present

## 2021-06-01 DIAGNOSIS — E785 Hyperlipidemia, unspecified: Secondary | ICD-10-CM | POA: Diagnosis not present

## 2021-06-01 DIAGNOSIS — E039 Hypothyroidism, unspecified: Secondary | ICD-10-CM | POA: Diagnosis not present

## 2021-06-01 DIAGNOSIS — M5136 Other intervertebral disc degeneration, lumbar region: Secondary | ICD-10-CM | POA: Diagnosis not present

## 2021-06-01 DIAGNOSIS — M5010 Cervical disc disorder with radiculopathy, unspecified cervical region: Secondary | ICD-10-CM | POA: Diagnosis not present

## 2021-06-07 DIAGNOSIS — J209 Acute bronchitis, unspecified: Secondary | ICD-10-CM | POA: Diagnosis not present

## 2021-06-07 DIAGNOSIS — J45901 Unspecified asthma with (acute) exacerbation: Secondary | ICD-10-CM | POA: Diagnosis not present

## 2021-06-09 DIAGNOSIS — N993 Prolapse of vaginal vault after hysterectomy: Secondary | ICD-10-CM | POA: Diagnosis not present

## 2021-06-14 DIAGNOSIS — R059 Cough, unspecified: Secondary | ICD-10-CM | POA: Diagnosis not present

## 2021-06-14 DIAGNOSIS — J45901 Unspecified asthma with (acute) exacerbation: Secondary | ICD-10-CM | POA: Diagnosis not present

## 2021-06-21 DIAGNOSIS — R399 Unspecified symptoms and signs involving the genitourinary system: Secondary | ICD-10-CM | POA: Diagnosis not present

## 2021-06-23 DIAGNOSIS — N3000 Acute cystitis without hematuria: Secondary | ICD-10-CM | POA: Diagnosis not present

## 2021-06-23 DIAGNOSIS — N398 Other specified disorders of urinary system: Secondary | ICD-10-CM | POA: Diagnosis not present

## 2021-06-23 DIAGNOSIS — N8111 Cystocele, midline: Secondary | ICD-10-CM | POA: Diagnosis not present

## 2021-06-24 DIAGNOSIS — E785 Hyperlipidemia, unspecified: Secondary | ICD-10-CM | POA: Diagnosis not present

## 2021-06-24 DIAGNOSIS — N1831 Chronic kidney disease, stage 3a: Secondary | ICD-10-CM | POA: Diagnosis not present

## 2021-06-24 DIAGNOSIS — M810 Age-related osteoporosis without current pathological fracture: Secondary | ICD-10-CM | POA: Diagnosis not present

## 2021-06-24 DIAGNOSIS — M5136 Other intervertebral disc degeneration, lumbar region: Secondary | ICD-10-CM | POA: Diagnosis not present

## 2021-07-02 ENCOUNTER — Other Ambulatory Visit (HOSPITAL_COMMUNITY): Payer: Self-pay | Admitting: *Deleted

## 2021-07-05 ENCOUNTER — Ambulatory Visit (HOSPITAL_COMMUNITY)
Admission: RE | Admit: 2021-07-05 | Discharge: 2021-07-05 | Disposition: A | Discharge status: Home / Self Care | Payer: Medicare Other | Source: Ambulatory Visit | Attending: Internal Medicine | Admitting: Internal Medicine

## 2021-07-05 ENCOUNTER — Other Ambulatory Visit: Payer: Self-pay

## 2021-07-05 DIAGNOSIS — M810 Age-related osteoporosis without current pathological fracture: Secondary | ICD-10-CM | POA: Diagnosis not present

## 2021-07-05 MED ORDER — DENOSUMAB 60 MG/ML ~~LOC~~ SOSY
PREFILLED_SYRINGE | SUBCUTANEOUS | Status: AC
  Filled 2021-07-05: qty 1

## 2021-07-05 MED ORDER — DENOSUMAB 60 MG/ML ~~LOC~~ SOSY
60.0000 mg | PREFILLED_SYRINGE | Freq: Once | SUBCUTANEOUS | Status: AC
  Administered 2021-07-05: 60 mg via SUBCUTANEOUS

## 2021-07-15 DIAGNOSIS — N1831 Chronic kidney disease, stage 3a: Secondary | ICD-10-CM | POA: Diagnosis not present

## 2021-07-15 DIAGNOSIS — E039 Hypothyroidism, unspecified: Secondary | ICD-10-CM | POA: Diagnosis not present

## 2021-07-15 DIAGNOSIS — R059 Cough, unspecified: Secondary | ICD-10-CM | POA: Diagnosis not present

## 2021-07-15 DIAGNOSIS — J45901 Unspecified asthma with (acute) exacerbation: Secondary | ICD-10-CM | POA: Diagnosis not present

## 2021-07-15 DIAGNOSIS — M5010 Cervical disc disorder with radiculopathy, unspecified cervical region: Secondary | ICD-10-CM | POA: Diagnosis not present

## 2021-07-15 DIAGNOSIS — I708 Atherosclerosis of other arteries: Secondary | ICD-10-CM | POA: Diagnosis not present

## 2021-07-15 DIAGNOSIS — M5136 Other intervertebral disc degeneration, lumbar region: Secondary | ICD-10-CM | POA: Diagnosis not present

## 2021-07-20 DIAGNOSIS — I10 Essential (primary) hypertension: Secondary | ICD-10-CM | POA: Diagnosis not present

## 2021-07-20 DIAGNOSIS — N993 Prolapse of vaginal vault after hysterectomy: Secondary | ICD-10-CM | POA: Diagnosis not present

## 2021-07-20 DIAGNOSIS — E039 Hypothyroidism, unspecified: Secondary | ICD-10-CM | POA: Diagnosis not present

## 2021-07-20 DIAGNOSIS — K219 Gastro-esophageal reflux disease without esophagitis: Secondary | ICD-10-CM | POA: Diagnosis not present

## 2021-07-20 DIAGNOSIS — Z87891 Personal history of nicotine dependence: Secondary | ICD-10-CM | POA: Diagnosis not present

## 2021-08-06 ENCOUNTER — Emergency Department (HOSPITAL_COMMUNITY): Payer: Medicare Other

## 2021-08-06 ENCOUNTER — Encounter (HOSPITAL_COMMUNITY): Payer: Self-pay | Admitting: *Deleted

## 2021-08-06 ENCOUNTER — Emergency Department (HOSPITAL_COMMUNITY)
Admission: EM | Admit: 2021-08-06 | Discharge: 2021-08-06 | Disposition: A | Discharge status: Home / Self Care | Payer: Medicare Other | Attending: Emergency Medicine | Admitting: Emergency Medicine

## 2021-08-06 DIAGNOSIS — W108XXA Fall (on) (from) other stairs and steps, initial encounter: Secondary | ICD-10-CM | POA: Diagnosis not present

## 2021-08-06 DIAGNOSIS — Z87891 Personal history of nicotine dependence: Secondary | ICD-10-CM | POA: Diagnosis not present

## 2021-08-06 DIAGNOSIS — J45909 Unspecified asthma, uncomplicated: Secondary | ICD-10-CM | POA: Diagnosis not present

## 2021-08-06 DIAGNOSIS — M25531 Pain in right wrist: Secondary | ICD-10-CM | POA: Diagnosis not present

## 2021-08-06 DIAGNOSIS — S80211A Abrasion, right knee, initial encounter: Secondary | ICD-10-CM | POA: Diagnosis not present

## 2021-08-06 DIAGNOSIS — Z7951 Long term (current) use of inhaled steroids: Secondary | ICD-10-CM | POA: Diagnosis not present

## 2021-08-06 DIAGNOSIS — W19XXXA Unspecified fall, initial encounter: Secondary | ICD-10-CM

## 2021-08-06 DIAGNOSIS — M79641 Pain in right hand: Secondary | ICD-10-CM | POA: Diagnosis not present

## 2021-08-06 DIAGNOSIS — S6991XA Unspecified injury of right wrist, hand and finger(s), initial encounter: Secondary | ICD-10-CM | POA: Diagnosis present

## 2021-08-06 DIAGNOSIS — I739 Peripheral vascular disease, unspecified: Secondary | ICD-10-CM | POA: Diagnosis not present

## 2021-08-06 DIAGNOSIS — S8001XA Contusion of right knee, initial encounter: Secondary | ICD-10-CM | POA: Diagnosis not present

## 2021-08-06 DIAGNOSIS — Y9301 Activity, walking, marching and hiking: Secondary | ICD-10-CM | POA: Diagnosis not present

## 2021-08-06 DIAGNOSIS — S66911A Strain of unspecified muscle, fascia and tendon at wrist and hand level, right hand, initial encounter: Secondary | ICD-10-CM | POA: Insufficient documentation

## 2021-08-06 DIAGNOSIS — S0003XA Contusion of scalp, initial encounter: Secondary | ICD-10-CM | POA: Insufficient documentation

## 2021-08-06 DIAGNOSIS — M25561 Pain in right knee: Secondary | ICD-10-CM | POA: Diagnosis not present

## 2021-08-06 DIAGNOSIS — S60511A Abrasion of right hand, initial encounter: Secondary | ICD-10-CM | POA: Diagnosis not present

## 2021-08-06 MED ORDER — TRAMADOL HCL 50 MG PO TABS
50.0000 mg | ORAL_TABLET | Freq: Once | ORAL | Status: AC
  Administered 2021-08-06: 50 mg via ORAL
  Filled 2021-08-06: qty 1

## 2021-08-06 NOTE — ED Provider Notes (Signed)
[facility] DEPT Provider Note   CSN: [id number] Arrival date & time: 08/06/21  0849     History Chief Complaint  Patient presents with   Lytle Michaels    Jessica Esparza is a 80 y.o. female.  Presents to ER with concern for hand pain, knee pain after fall.  Patient reports that she tripped while walking up the steps this morning.  No loss of consciousness.  Did not hit her head.  Denies any head pain, neck pain or back pain at present.  No associated chest or abdominal pain.  Pain is primarily in her right hand, moderate, worse with movement, improved with rest.  Has not taken any medicine yet.  No numbness or weakness.  Also noted abrasion to her right knee.  HPI     Past Medical History:  Diagnosis Date   Asthma    Celiac disease    Osteoporosis    Thyroid disease     Patient Active Problem List   Diagnosis Date Noted   Asthmatic bronchitis 07/20/2017   Epistaxis 10/10/2016    Past Surgical History:  Procedure Laterality Date   ABDOMINAL HYSTERECTOMY     CHOLECYSTECTOMY     SINUS EXPLORATION       OB History   No obstetric history on file.     No family history on file.  Social History   Tobacco Use   Smoking status: Former    Packs/day: 1.00    Years: 20.00    Pack years: 20.00    Types: Cigarettes    Quit date: 12/27/1979    Years since quitting: 41.6   Smokeless tobacco: Never    Home Medications Prior to Admission medications   Medication Sig Start Date End Date Taking? Authorizing Provider  albuterol (PROVENTIL HFA;VENTOLIN HFA) 108 (90 Base) MCG/ACT inhaler Inhale 2 puffs into the lungs every 6 (six) hours as needed.     [provider]  allopurinol (ZYLOPRIM) 100 MG tablet Take 100 mg by mouth daily.    [provider]  ALPRAZolam Duanne Moron) 0.5 MG tablet alprazolam 0.5 mg tablet    [provider]  Azelaic Acid 15 % cream Apply 1 application topically 2 (two) times daily. 11/28/19   [provider]  cetirizine (ZYRTEC) 10 MG tablet Take 10 mg by mouth.    [provider]  clobetasol cream (TEMOVATE) 0.05 % Apply topically.    [provider]  colchicine 0.6 MG tablet Take 0.6 mg by mouth daily as needed.     [provider]  conjugated estrogens (PREMARIN) vaginal cream Place vaginally.    [provider]  estrogens, conjugated, (PREMARIN) 0.45 MG tablet Take by mouth.    [provider]  famotidine (PEPCID) 20 MG tablet Take 20 mg by mouth.    [provider]  fluticasone Asencion Islam) 50 MCG/ACT nasal spray  07/01/19   [provider]  Fluticasone Propionate (FLONASE NA) Place into the nose 2 (two) times daily.    [provider]  HYDROcodone-acetaminophen (NORCO/VICODIN) 5-325 MG tablet Take 1 tablet by mouth every 6 (six) hours as needed for severe pain. 02/25/18   Carmin Muskrat, MD  hydroxychloroquine (PLAQUENIL) 200 MG tablet Take 200 mg by mouth 2 (two) times daily. 11/06/19   [provider]  Levothyroxine Sodium (SYNTHROID PO) Take 150 mcg by mouth daily.     [provider]  lidocaine (LIDODERM) 5 % Place 1 patch onto the skin daily. Remove & Discard  patch within 12 hours or as directed by MD 02/25/18   Carmin Muskrat, MD  losartan (COZAAR) 50 MG tablet losartan 50 mg tablet    [provider]  mometasone (ASMANEX 60 METERED DOSES) 220 MCG/INH inhaler Inhale 2 puffs into the lungs daily.    [provider]  montelukast (SINGULAIR) 10 MG tablet Take 10 mg by mouth at bedtime.    [provider]  predniSONE (DELTASONE) 5 MG tablet  10/22/19   [provider]  simvastatin (ZOCOR) 20 MG tablet Take 20 mg by mouth daily.    [provider]  sucralfate (CARAFATE) 1 g tablet Take 1 g by mouth 4 (four) times daily -  with meals and at bedtime.    [provider]  SYMBICORT 160-4.5 MCG/ACT inhaler  08/28/19   [provider]   SYNTHROID 150 MCG tablet Take 150 mcg by mouth daily. 01/21/20   [provider]  Zoster Vaccine Adjuvanted [facility]) injection Shingrix (PF) 50 mcg/0.5 mL intramuscular suspension, kit    [provider]    Allergies    Nsaids, Aspirin, Cefdinir, Esomeprazole magnesium, and Penicillins  Review of Systems   Review of Systems  Musculoskeletal:  Positive for arthralgias.  Neurological:  Negative for syncope.  All other systems reviewed and are negative.  Physical Exam Updated Vital Signs BP (!) 154/75   Pulse 71   Temp (!) 97.5 F (36.4 C) (Oral)   Resp 18   Ht _0  (1.651 m)   Wt 100 kg   SpO2 100%   BMI 36.69 kg/m   Physical Exam Vitals and nursing note reviewed.  Constitutional:      General: She is not in acute distress.    Appearance: She is well-developed.  HENT:     Head: Normocephalic.     Comments: Superficial abrasion and small hematoma to the right forehead, no active bleeding noted Eyes:     Conjunctiva/sclera: Conjunctivae normal.  Cardiovascular:     Rate and Rhythm: Normal rate.     Pulses: Normal pulses.  Pulmonary:     Effort: Pulmonary effort is normal. No respiratory distress.  Abdominal:     Palpations: Abdomen is soft.     Tenderness: There is no abdominal tenderness.  Musculoskeletal:     Cervical back: Normal range of motion and neck supple. No rigidity or tenderness.     Comments: Back: no C, T, L spine TTP, no step off or deformity RUE: Some tenderness over the hand, normal joint ROM, radial pulse intact, distal sensation and motor intact LUE: no TTP throughout, no deformity, normal joint ROM, radial pulse intact, distal sensation and motor intact RLE: Mild tenderness to the knee, superficial abrasion to anterior knee, no active bleeding, normal joint ROM, distal pulse, sensation and motor intact LLE: no TTP throughout, no deformity, normal joint ROM, distal pulse, sensation and motor intact  Skin:    General: Skin is  warm and dry.  Neurological:     General: No focal deficit present.     Mental Status: She is alert and oriented to person, place, and time.    ED Results / Procedures / Treatments   Labs (all labs ordered are listed, but only abnormal results are displayed) Labs Reviewed - No data to display  EKG None  Radiology DG Wrist Complete Right  Result Date: 08/06/2021 CLINICAL DATA:  80 year old female status post fall with pain. EXAM: RIGHT WRIST - COMPLETE 3+ VIEW COMPARISON:  None. FINDINGS: Osteopenia. Distal  radius and ulna appear intact. Carpal bone alignment is maintained, but there is subchondral sclerosis and mild erosion at the scapholunate interval. No acute carpal bone fracture identified. Proximal metacarpals appear intact. No discrete soft tissue injury. IMPRESSION: 1. No acute fracture or dislocation identified about the right wrist. 2. Chronic degeneration suspected at the scapholunate interval. Electronically Signed   By: Genevie Ann M.D.   On: 08/06/2021 10:05   CT HEAD WO CONTRAST (5MM)  Result Date: 08/06/2021 CLINICAL DATA:  80 year old female status post fall with supraorbital hematoma. EXAM: CT HEAD WITHOUT CONTRAST TECHNIQUE: Contiguous axial images were obtained from the base of the skull through the vertex without intravenous contrast. COMPARISON:  Head CT 03/20/2020. FINDINGS: Brain: Cerebral volume remains normal for age. No midline shift, ventriculomegaly, mass effect, evidence of mass lesion, intracranial hemorrhage or evidence of cortically based acute infarction. Gray-white matter differentiation is within normal limits throughout the brain. Vascular: Calcified atherosclerosis at the skull base. No suspicious intracranial vascular hyperdensity. Skull: No acute osseous abnormality identified. Sinuses/Orbits: Chronic paranasal sinusitis with stable small maxillary fluid levels. Tympanic cavities and mastoids remain pneumatized. Other: Right lateral supraorbital scalp hematoma  on series 3, image 19. Underlying right orbital walls appear stable and intact. Stable globes and intraorbital soft tissues. No scalp soft tissue gas. IMPRESSION: 1. Right supraorbital scalp hematoma without underlying fracture. 2. Stable and normal for age non contrast CT appearance of the brain. 3. Chronic paranasal sinusitis. Electronically Signed   By: Genevie Ann M.D.   On: 08/06/2021 10:13   DG Knee Complete 4 Views Right  Result Date: 08/06/2021 CLINICAL DATA:  80 year old female status post fall with pain. EXAM: RIGHT KNEE - COMPLETE 4+ VIEW COMPARISON:  None. FINDINGS: Osteopenia. Joint spaces and alignment are normal for age. No evidence of joint effusion. Patella is intact. No acute osseous abnormality identified. Calcified peripheral vascular disease. IMPRESSION: 1. No acute fracture or dislocation identified about the right knee. 2. Calcified peripheral vascular disease. Electronically Signed   By: Genevie Ann M.D.   On: 08/06/2021 10:04   DG Hand Complete Right  Result Date: 08/06/2021 CLINICAL DATA:  Right hand pain after fall EXAM: RIGHT HAND - COMPLETE 3+ VIEW COMPARISON:  None. FINDINGS: Alignment is anatomic. No acute fracture. Degenerative changes at the interphalangeal joints, greatest at the distal DIP. IMPRESSION: No acute fracture. Electronically Signed   By: Macy Mis M.D.   On: 08/06/2021 10:40    Procedures Procedures   Medications Ordered in ED Medications  traMADol (ULTRAM) tablet 50 mg (50 mg Oral Given 08/06/21 9449)    ED Course  I have reviewed the triage vital signs and the nursing notes.  Pertinent labs & imaging results that were available during my care of the patient were reviewed by me and considered in my medical decision making (see chart for details).    MDM Rules/Calculators/A&P                           80 year old lady presents to ER after mechanical fall.  Noted superficial wound to right forehead, right knee as well as some pain in her right  hand.  CT imaging of head is negative.  X-rays negative.  Reviewed return precautions, wound care and discharged home.  After the discussed management above, the patient was determined to be safe for discharge.  The patient was in agreement with this plan and all questions regarding their care were answered.  ED return  precautions were discussed and the patient will return to the ED with any significant worsening of condition.  Final Clinical Impression(s) / ED Diagnoses Final diagnoses:  Fall, initial encounter  Hematoma of scalp, initial encounter  Hand strain, right, initial encounter  Knee abrasion, right, initial encounter    Rx / DC Orders ED Discharge Orders     None        Lucrezia Starch, MD 08/06/21 1152

## 2021-08-06 NOTE — ED Triage Notes (Signed)
Pt complains of right hand, right forehead, and right knee abrasion since tripping and falling up a step this morning. No loss of consciousness. No blood thinners. Concerned she broke rt hand.

## 2021-08-06 NOTE — ED Notes (Signed)
Pt right knee cleaned and bandaged with gauze.

## 2021-08-06 NOTE — Discharge Instructions (Addendum)
Keep your wounds clean and dry.  Can run gentle water over top for cleaning but avoid vigorous scrubbing.  Follow-up with your primary doctor as needed.  Come back to ER as needed.  For today recommend resting your hand and then starting to use it more tomorrow as tolerated.

## 2021-08-31 DIAGNOSIS — M150 Primary generalized (osteo)arthritis: Secondary | ICD-10-CM | POA: Diagnosis not present

## 2021-08-31 DIAGNOSIS — M1A09X0 Idiopathic chronic gout, multiple sites, without tophus (tophi): Secondary | ICD-10-CM | POA: Diagnosis not present

## 2021-08-31 DIAGNOSIS — E669 Obesity, unspecified: Secondary | ICD-10-CM | POA: Diagnosis not present

## 2021-08-31 DIAGNOSIS — Z6834 Body mass index (BMI) 34.0-34.9, adult: Secondary | ICD-10-CM | POA: Diagnosis not present

## 2021-08-31 DIAGNOSIS — Z862 Personal history of diseases of the blood and blood-forming organs and certain disorders involving the immune mechanism: Secondary | ICD-10-CM | POA: Diagnosis not present

## 2021-08-31 DIAGNOSIS — M1990 Unspecified osteoarthritis, unspecified site: Secondary | ICD-10-CM | POA: Diagnosis not present

## 2021-09-09 DIAGNOSIS — Z20822 Contact with and (suspected) exposure to covid-19: Secondary | ICD-10-CM | POA: Diagnosis not present

## 2021-09-17 DIAGNOSIS — Z23 Encounter for immunization: Secondary | ICD-10-CM | POA: Diagnosis not present

## 2021-09-24 DIAGNOSIS — W19XXXA Unspecified fall, initial encounter: Secondary | ICD-10-CM | POA: Diagnosis not present

## 2021-09-24 DIAGNOSIS — T148XXA Other injury of unspecified body region, initial encounter: Secondary | ICD-10-CM | POA: Diagnosis not present

## 2021-10-01 DIAGNOSIS — T148XXA Other injury of unspecified body region, initial encounter: Secondary | ICD-10-CM | POA: Diagnosis not present

## 2021-10-01 DIAGNOSIS — Z23 Encounter for immunization: Secondary | ICD-10-CM | POA: Diagnosis not present

## 2021-10-08 DIAGNOSIS — T148XXS Other injury of unspecified body region, sequela: Secondary | ICD-10-CM | POA: Diagnosis not present

## 2021-10-10 DIAGNOSIS — Z20822 Contact with and (suspected) exposure to covid-19: Secondary | ICD-10-CM | POA: Diagnosis not present

## 2021-10-27 DIAGNOSIS — Z961 Presence of intraocular lens: Secondary | ICD-10-CM | POA: Diagnosis not present

## 2021-10-27 DIAGNOSIS — Z79899 Other long term (current) drug therapy: Secondary | ICD-10-CM | POA: Diagnosis not present

## 2021-10-27 DIAGNOSIS — H5213 Myopia, bilateral: Secondary | ICD-10-CM | POA: Diagnosis not present

## 2021-11-15 DIAGNOSIS — M5416 Radiculopathy, lumbar region: Secondary | ICD-10-CM | POA: Diagnosis not present

## 2021-11-15 DIAGNOSIS — M48061 Spinal stenosis, lumbar region without neurogenic claudication: Secondary | ICD-10-CM | POA: Diagnosis not present

## 2021-11-17 DIAGNOSIS — M5416 Radiculopathy, lumbar region: Secondary | ICD-10-CM | POA: Diagnosis not present

## 2021-11-22 DIAGNOSIS — M25532 Pain in left wrist: Secondary | ICD-10-CM | POA: Diagnosis not present

## 2021-11-24 DIAGNOSIS — S63502A Unspecified sprain of left wrist, initial encounter: Secondary | ICD-10-CM | POA: Diagnosis not present

## 2021-11-30 DIAGNOSIS — M25532 Pain in left wrist: Secondary | ICD-10-CM | POA: Diagnosis not present

## 2021-11-30 DIAGNOSIS — S63502A Unspecified sprain of left wrist, initial encounter: Secondary | ICD-10-CM | POA: Diagnosis not present

## 2021-12-07 DIAGNOSIS — S52602A Unspecified fracture of lower end of left ulna, initial encounter for closed fracture: Secondary | ICD-10-CM | POA: Diagnosis not present

## 2021-12-07 DIAGNOSIS — S52609A Unspecified fracture of lower end of unspecified ulna, initial encounter for closed fracture: Secondary | ICD-10-CM | POA: Insufficient documentation

## 2021-12-07 DIAGNOSIS — S63502A Unspecified sprain of left wrist, initial encounter: Secondary | ICD-10-CM | POA: Diagnosis not present

## 2021-12-07 DIAGNOSIS — M25532 Pain in left wrist: Secondary | ICD-10-CM | POA: Diagnosis not present

## 2021-12-14 DIAGNOSIS — S63502A Unspecified sprain of left wrist, initial encounter: Secondary | ICD-10-CM | POA: Diagnosis not present

## 2021-12-14 DIAGNOSIS — S52602A Unspecified fracture of lower end of left ulna, initial encounter for closed fracture: Secondary | ICD-10-CM | POA: Diagnosis not present

## 2021-12-14 DIAGNOSIS — M25532 Pain in left wrist: Secondary | ICD-10-CM | POA: Diagnosis not present

## 2021-12-29 DIAGNOSIS — E785 Hyperlipidemia, unspecified: Secondary | ICD-10-CM | POA: Diagnosis not present

## 2021-12-29 DIAGNOSIS — E039 Hypothyroidism, unspecified: Secondary | ICD-10-CM | POA: Diagnosis not present

## 2021-12-29 DIAGNOSIS — M810 Age-related osteoporosis without current pathological fracture: Secondary | ICD-10-CM | POA: Diagnosis not present

## 2021-12-29 DIAGNOSIS — M109 Gout, unspecified: Secondary | ICD-10-CM | POA: Diagnosis not present

## 2022-01-05 DIAGNOSIS — Z0000 Encounter for general adult medical examination without abnormal findings: Secondary | ICD-10-CM | POA: Diagnosis not present

## 2022-01-05 DIAGNOSIS — I011 Acute rheumatic endocarditis: Secondary | ICD-10-CM | POA: Diagnosis not present

## 2022-01-05 DIAGNOSIS — I708 Atherosclerosis of other arteries: Secondary | ICD-10-CM | POA: Diagnosis not present

## 2022-01-05 DIAGNOSIS — E785 Hyperlipidemia, unspecified: Secondary | ICD-10-CM | POA: Diagnosis not present

## 2022-01-05 DIAGNOSIS — E039 Hypothyroidism, unspecified: Secondary | ICD-10-CM | POA: Diagnosis not present

## 2022-01-05 DIAGNOSIS — M791 Myalgia, unspecified site: Secondary | ICD-10-CM | POA: Diagnosis not present

## 2022-01-05 DIAGNOSIS — E663 Overweight: Secondary | ICD-10-CM | POA: Diagnosis not present

## 2022-01-05 DIAGNOSIS — N1831 Chronic kidney disease, stage 3a: Secondary | ICD-10-CM | POA: Diagnosis not present

## 2022-01-05 DIAGNOSIS — Z1212 Encounter for screening for malignant neoplasm of rectum: Secondary | ICD-10-CM | POA: Diagnosis not present

## 2022-01-05 DIAGNOSIS — M5010 Cervical disc disorder with radiculopathy, unspecified cervical region: Secondary | ICD-10-CM | POA: Diagnosis not present

## 2022-01-05 DIAGNOSIS — Z1231 Encounter for screening mammogram for malignant neoplasm of breast: Secondary | ICD-10-CM | POA: Diagnosis not present

## 2022-01-05 DIAGNOSIS — R82998 Other abnormal findings in urine: Secondary | ICD-10-CM | POA: Diagnosis not present

## 2022-01-05 DIAGNOSIS — Z1331 Encounter for screening for depression: Secondary | ICD-10-CM | POA: Diagnosis not present

## 2022-01-05 DIAGNOSIS — M810 Age-related osteoporosis without current pathological fracture: Secondary | ICD-10-CM | POA: Diagnosis not present

## 2022-01-05 DIAGNOSIS — M109 Gout, unspecified: Secondary | ICD-10-CM | POA: Diagnosis not present

## 2022-01-11 DIAGNOSIS — S52602A Unspecified fracture of lower end of left ulna, initial encounter for closed fracture: Secondary | ICD-10-CM | POA: Diagnosis not present

## 2022-01-21 ENCOUNTER — Other Ambulatory Visit (HOSPITAL_COMMUNITY): Payer: Self-pay | Admitting: *Deleted

## 2022-01-24 ENCOUNTER — Encounter (HOSPITAL_COMMUNITY)
Admission: RE | Admit: 2022-01-24 | Discharge: 2022-01-24 | Disposition: A | Discharge status: Home / Self Care | Payer: Medicare Other | Source: Ambulatory Visit | Attending: Internal Medicine | Admitting: Internal Medicine

## 2022-01-24 DIAGNOSIS — M810 Age-related osteoporosis without current pathological fracture: Secondary | ICD-10-CM | POA: Insufficient documentation

## 2022-01-24 MED ORDER — DENOSUMAB 60 MG/ML ~~LOC~~ SOSY
60.0000 mg | PREFILLED_SYRINGE | Freq: Once | SUBCUTANEOUS | Status: AC
  Administered 2022-01-24: 60 mg via SUBCUTANEOUS

## 2022-01-24 MED ORDER — DENOSUMAB 60 MG/ML ~~LOC~~ SOSY
PREFILLED_SYRINGE | SUBCUTANEOUS | Status: AC
  Filled 2022-01-24: qty 1

## 2022-01-25 DIAGNOSIS — Z23 Encounter for immunization: Secondary | ICD-10-CM | POA: Diagnosis not present

## 2022-02-09 ENCOUNTER — Other Ambulatory Visit: Payer: Self-pay

## 2022-02-09 ENCOUNTER — Ambulatory Visit (INDEPENDENT_AMBULATORY_CARE_PROVIDER_SITE_OTHER): Payer: Medicare Other | Admitting: Podiatry

## 2022-02-09 ENCOUNTER — Encounter: Payer: Self-pay | Admitting: Podiatry

## 2022-02-09 DIAGNOSIS — B351 Tinea unguium: Secondary | ICD-10-CM | POA: Diagnosis not present

## 2022-02-09 DIAGNOSIS — M25632 Stiffness of left wrist, not elsewhere classified: Secondary | ICD-10-CM | POA: Diagnosis not present

## 2022-02-09 DIAGNOSIS — Z862 Personal history of diseases of the blood and blood-forming organs and certain disorders involving the immune mechanism: Secondary | ICD-10-CM | POA: Insufficient documentation

## 2022-02-09 DIAGNOSIS — M79675 Pain in left toe(s): Secondary | ICD-10-CM

## 2022-02-09 DIAGNOSIS — M1990 Unspecified osteoarthritis, unspecified site: Secondary | ICD-10-CM | POA: Insufficient documentation

## 2022-02-09 DIAGNOSIS — M2550 Pain in unspecified joint: Secondary | ICD-10-CM | POA: Insufficient documentation

## 2022-02-09 DIAGNOSIS — S52602A Unspecified fracture of lower end of left ulna, initial encounter for closed fracture: Secondary | ICD-10-CM | POA: Diagnosis not present

## 2022-02-09 DIAGNOSIS — M1A9XX0 Chronic gout, unspecified, without tophus (tophi): Secondary | ICD-10-CM

## 2022-02-09 DIAGNOSIS — Z6833 Body mass index (BMI) 33.0-33.9, adult: Secondary | ICD-10-CM | POA: Insufficient documentation

## 2022-02-09 DIAGNOSIS — M79674 Pain in right toe(s): Secondary | ICD-10-CM | POA: Diagnosis not present

## 2022-02-09 DIAGNOSIS — M1991 Primary osteoarthritis, unspecified site: Secondary | ICD-10-CM | POA: Insufficient documentation

## 2022-02-09 DIAGNOSIS — M1A00X0 Idiopathic chronic gout, unspecified site, without tophus (tophi): Secondary | ICD-10-CM

## 2022-02-09 HISTORY — DX: Idiopathic chronic gout, unspecified site, without tophus (tophi): M1A.00X0

## 2022-02-09 HISTORY — DX: Chronic gout, unspecified, without tophus (tophi): M1A.9XX0

## 2022-02-14 NOTE — Progress Notes (Signed)
°  Subjective:  Patient ID: Jessica Esparza, female    DOB: 03-13-1941,  MRN: [id number]  Jessica Esparza presents to clinic today for painful elongated mycotic toenails 1-5 bilaterally which are tender when wearing enclosed shoe gear. Pain is relieved with periodic professional debridement.  New problem(s): None. ingrown toenail to the medial border left hallux and medial border right hallux which are both tender when wearing enclosed shoe gear. She denies any redness, drainage or swelling. Denies any fever, chills, night sweats, nausea or vomiting.  PCP is Crist Infante, MD , and last visit was February 09, 2022.  Allergies  Allergen Reactions   Nsaids Shortness Of Breath    Wheezing   Aspirin Other (See Comments)    Other   Cefdinir     Reported on June 2018   Esomeprazole Magnesium Nausea And Vomiting    Most meds for GERD   Penicillins Hives    Review of Systems: Negative except as noted in the HPI. Objective:   Constitutional Jessica Esparza is a pleasant 81 y.o. Caucasian female, in NAD. AAO x 3.   Vascular CFT immediate b/l LE. Palpable DP/PT pulses b/l LE. Digital hair sparse b/l. Skin temperature gradient WNL b/l. No pain with calf compression b/l. No edema noted b/l. No cyanosis or clubbing noted b/l LE.  Neurologic Normal speech. Oriented to person, place, and time. Protective sensation intact 5/5 intact bilaterally with 10g monofilament b/l. Vibratory sensation intact b/l. Proprioception intact bilaterally.  Dermatologic Pedal integument with normal turgor, texture and tone b/l LE. No open wounds b/l. No interdigital macerations b/l. Toenails 1-5 b/l elongated, thickened, discolored with subungual debris. +Tenderness with dorsal palpation of nailplates. No hyperkeratotic or porokeratotic lesions present. Incurvated nailplate medial border(s) bilateral great toes.  Nail border hypertrophy absent. There is tenderness to palpation. Sign(s) of infection: no clinical signs of  infection noted on examination today..  Orthopedic: Normal muscle strength 5/5 to all lower extremity muscle groups bilaterally. Hammertoe(s) noted to the L 5th toe.Marland Kitchen No pain, crepitus or joint limitation noted with ROM b/l LE.  Patient ambulates independently without assistive aids.   Radiographs: None  Last A1c: No flowsheet data found.   Assessment:   1. Pain due to onychomycosis of toenails of both feet    Plan:  Patient was evaluated and treated and all questions answered. Consent given for treatment as described below: -Mycotic toenails 1-5 bilaterally were debrided in length and girth with sterile nail nippers and dremel without incident. -Offending nail border debrided and curretaged bilateral great toes utilizing sterile nail nipper and currette. Border cleansed with alcohol and triple antibiotic applied. No further treatment required by patient/caregiver. -Patient/POA to call should there be question/concern in the interim.  Return in about 4 months (around 06/09/2022).  Marzetta Board, DPM

## 2022-03-02 DIAGNOSIS — M1991 Primary osteoarthritis, unspecified site: Secondary | ICD-10-CM | POA: Diagnosis not present

## 2022-03-02 DIAGNOSIS — M064 Inflammatory polyarthropathy: Secondary | ICD-10-CM | POA: Diagnosis not present

## 2022-03-02 DIAGNOSIS — Z862 Personal history of diseases of the blood and blood-forming organs and certain disorders involving the immune mechanism: Secondary | ICD-10-CM | POA: Diagnosis not present

## 2022-03-02 DIAGNOSIS — M810 Age-related osteoporosis without current pathological fracture: Secondary | ICD-10-CM | POA: Diagnosis not present

## 2022-03-02 DIAGNOSIS — E669 Obesity, unspecified: Secondary | ICD-10-CM | POA: Diagnosis not present

## 2022-03-02 DIAGNOSIS — Z6835 Body mass index (BMI) 35.0-35.9, adult: Secondary | ICD-10-CM | POA: Diagnosis not present

## 2022-03-02 DIAGNOSIS — M1A09X0 Idiopathic chronic gout, multiple sites, without tophus (tophi): Secondary | ICD-10-CM | POA: Diagnosis not present

## 2022-03-09 DIAGNOSIS — L570 Actinic keratosis: Secondary | ICD-10-CM | POA: Diagnosis not present

## 2022-03-09 DIAGNOSIS — D1801 Hemangioma of skin and subcutaneous tissue: Secondary | ICD-10-CM | POA: Diagnosis not present

## 2022-03-09 DIAGNOSIS — D2261 Melanocytic nevi of right upper limb, including shoulder: Secondary | ICD-10-CM | POA: Diagnosis not present

## 2022-03-09 DIAGNOSIS — L738 Other specified follicular disorders: Secondary | ICD-10-CM | POA: Diagnosis not present

## 2022-03-09 DIAGNOSIS — D485 Neoplasm of uncertain behavior of skin: Secondary | ICD-10-CM | POA: Diagnosis not present

## 2022-03-09 DIAGNOSIS — D2262 Melanocytic nevi of left upper limb, including shoulder: Secondary | ICD-10-CM | POA: Diagnosis not present

## 2022-03-09 DIAGNOSIS — L821 Other seborrheic keratosis: Secondary | ICD-10-CM | POA: Diagnosis not present

## 2022-03-09 DIAGNOSIS — Z85828 Personal history of other malignant neoplasm of skin: Secondary | ICD-10-CM | POA: Diagnosis not present

## 2022-03-09 DIAGNOSIS — E039 Hypothyroidism, unspecified: Secondary | ICD-10-CM | POA: Diagnosis not present

## 2022-03-09 DIAGNOSIS — D692 Other nonthrombocytopenic purpura: Secondary | ICD-10-CM | POA: Diagnosis not present

## 2022-03-09 DIAGNOSIS — L718 Other rosacea: Secondary | ICD-10-CM | POA: Diagnosis not present

## 2022-03-09 DIAGNOSIS — D225 Melanocytic nevi of trunk: Secondary | ICD-10-CM | POA: Diagnosis not present

## 2022-05-05 DIAGNOSIS — K900 Celiac disease: Secondary | ICD-10-CM | POA: Diagnosis not present

## 2022-05-05 DIAGNOSIS — K219 Gastro-esophageal reflux disease without esophagitis: Secondary | ICD-10-CM | POA: Diagnosis not present

## 2022-05-05 DIAGNOSIS — Z86010 Personal history of colonic polyps: Secondary | ICD-10-CM | POA: Diagnosis not present

## 2022-05-05 DIAGNOSIS — E669 Obesity, unspecified: Secondary | ICD-10-CM | POA: Diagnosis not present

## 2022-05-05 DIAGNOSIS — R635 Abnormal weight gain: Secondary | ICD-10-CM | POA: Diagnosis not present

## 2022-05-10 DIAGNOSIS — Z23 Encounter for immunization: Secondary | ICD-10-CM | POA: Diagnosis not present

## 2022-06-10 ENCOUNTER — Ambulatory Visit: Payer: Medicare Other | Admitting: Podiatry

## 2022-07-18 DIAGNOSIS — E039 Hypothyroidism, unspecified: Secondary | ICD-10-CM | POA: Diagnosis not present

## 2022-07-18 DIAGNOSIS — I011 Acute rheumatic endocarditis: Secondary | ICD-10-CM | POA: Diagnosis not present

## 2022-07-18 DIAGNOSIS — K900 Celiac disease: Secondary | ICD-10-CM | POA: Diagnosis not present

## 2022-07-18 DIAGNOSIS — M810 Age-related osteoporosis without current pathological fracture: Secondary | ICD-10-CM | POA: Diagnosis not present

## 2022-07-18 DIAGNOSIS — K5730 Diverticulosis of large intestine without perforation or abscess without bleeding: Secondary | ICD-10-CM | POA: Diagnosis not present

## 2022-07-18 DIAGNOSIS — N1831 Chronic kidney disease, stage 3a: Secondary | ICD-10-CM | POA: Diagnosis not present

## 2022-07-21 ENCOUNTER — Other Ambulatory Visit (HOSPITAL_COMMUNITY): Payer: Self-pay | Admitting: *Deleted

## 2022-07-22 ENCOUNTER — Encounter (HOSPITAL_COMMUNITY)
Admission: RE | Admit: 2022-07-22 | Discharge: 2022-07-22 | Disposition: A | Discharge status: Home / Self Care | Payer: Medicare Other | Source: Ambulatory Visit | Attending: Internal Medicine | Admitting: Internal Medicine

## 2022-07-22 DIAGNOSIS — M810 Age-related osteoporosis without current pathological fracture: Secondary | ICD-10-CM | POA: Insufficient documentation

## 2022-07-22 MED ORDER — DENOSUMAB 60 MG/ML ~~LOC~~ SOSY
PREFILLED_SYRINGE | SUBCUTANEOUS | Status: AC
  Filled 2022-07-22: qty 1

## 2022-07-22 MED ORDER — DENOSUMAB 60 MG/ML ~~LOC~~ SOSY
60.0000 mg | PREFILLED_SYRINGE | Freq: Once | SUBCUTANEOUS | Status: AC
  Administered 2022-07-22: 60 mg via SUBCUTANEOUS

## 2022-09-15 DIAGNOSIS — Z23 Encounter for immunization: Secondary | ICD-10-CM | POA: Diagnosis not present

## 2022-10-01 DIAGNOSIS — Z23 Encounter for immunization: Secondary | ICD-10-CM | POA: Diagnosis not present

## 2022-11-23 DIAGNOSIS — M5416 Radiculopathy, lumbar region: Secondary | ICD-10-CM | POA: Diagnosis not present

## 2022-11-23 DIAGNOSIS — M48061 Spinal stenosis, lumbar region without neurogenic claudication: Secondary | ICD-10-CM | POA: Diagnosis not present

## 2022-12-01 DIAGNOSIS — Z85828 Personal history of other malignant neoplasm of skin: Secondary | ICD-10-CM | POA: Diagnosis not present

## 2022-12-01 DIAGNOSIS — L570 Actinic keratosis: Secondary | ICD-10-CM | POA: Diagnosis not present

## 2022-12-02 DIAGNOSIS — M5416 Radiculopathy, lumbar region: Secondary | ICD-10-CM | POA: Diagnosis not present

## 2023-01-10 DIAGNOSIS — M48061 Spinal stenosis, lumbar region without neurogenic claudication: Secondary | ICD-10-CM | POA: Diagnosis not present

## 2023-01-10 DIAGNOSIS — M5416 Radiculopathy, lumbar region: Secondary | ICD-10-CM | POA: Diagnosis not present

## 2023-01-10 DIAGNOSIS — Z6835 Body mass index (BMI) 35.0-35.9, adult: Secondary | ICD-10-CM | POA: Diagnosis not present

## 2023-01-17 DIAGNOSIS — M109 Gout, unspecified: Secondary | ICD-10-CM | POA: Diagnosis not present

## 2023-01-17 DIAGNOSIS — E785 Hyperlipidemia, unspecified: Secondary | ICD-10-CM | POA: Diagnosis not present

## 2023-01-17 DIAGNOSIS — M810 Age-related osteoporosis without current pathological fracture: Secondary | ICD-10-CM | POA: Diagnosis not present

## 2023-01-17 DIAGNOSIS — E039 Hypothyroidism, unspecified: Secondary | ICD-10-CM | POA: Diagnosis not present

## 2023-01-17 DIAGNOSIS — R7989 Other specified abnormal findings of blood chemistry: Secondary | ICD-10-CM | POA: Diagnosis not present

## 2023-01-24 DIAGNOSIS — E785 Hyperlipidemia, unspecified: Secondary | ICD-10-CM | POA: Diagnosis not present

## 2023-01-24 DIAGNOSIS — K900 Celiac disease: Secondary | ICD-10-CM | POA: Diagnosis not present

## 2023-01-24 DIAGNOSIS — I8390 Asymptomatic varicose veins of unspecified lower extremity: Secondary | ICD-10-CM | POA: Diagnosis not present

## 2023-01-24 DIAGNOSIS — Z0000 Encounter for general adult medical examination without abnormal findings: Secondary | ICD-10-CM | POA: Diagnosis not present

## 2023-01-24 DIAGNOSIS — N1831 Chronic kidney disease, stage 3a: Secondary | ICD-10-CM | POA: Diagnosis not present

## 2023-01-24 DIAGNOSIS — M810 Age-related osteoporosis without current pathological fracture: Secondary | ICD-10-CM | POA: Diagnosis not present

## 2023-01-24 DIAGNOSIS — E039 Hypothyroidism, unspecified: Secondary | ICD-10-CM | POA: Diagnosis not present

## 2023-01-24 DIAGNOSIS — E663 Overweight: Secondary | ICD-10-CM | POA: Diagnosis not present

## 2023-01-24 DIAGNOSIS — R82998 Other abnormal findings in urine: Secondary | ICD-10-CM | POA: Diagnosis not present

## 2023-01-24 DIAGNOSIS — I708 Atherosclerosis of other arteries: Secondary | ICD-10-CM | POA: Diagnosis not present

## 2023-01-24 DIAGNOSIS — M5136 Other intervertebral disc degeneration, lumbar region: Secondary | ICD-10-CM | POA: Diagnosis not present

## 2023-01-31 ENCOUNTER — Other Ambulatory Visit (HOSPITAL_COMMUNITY): Payer: Self-pay

## 2023-02-01 ENCOUNTER — Ambulatory Visit (HOSPITAL_COMMUNITY)
Admission: RE | Admit: 2023-02-01 | Discharge: 2023-02-01 | Disposition: A | Discharge status: Home / Self Care | Payer: Medicare Other | Source: Ambulatory Visit | Attending: Internal Medicine | Admitting: Internal Medicine

## 2023-02-01 DIAGNOSIS — M810 Age-related osteoporosis without current pathological fracture: Secondary | ICD-10-CM | POA: Diagnosis not present

## 2023-02-01 MED ORDER — DENOSUMAB 60 MG/ML ~~LOC~~ SOSY
60.0000 mg | PREFILLED_SYRINGE | Freq: Once | SUBCUTANEOUS | Status: AC
  Administered 2023-02-01: 60 mg via SUBCUTANEOUS

## 2023-02-01 MED ORDER — DENOSUMAB 60 MG/ML ~~LOC~~ SOSY
PREFILLED_SYRINGE | SUBCUTANEOUS | Status: AC
  Filled 2023-02-01: qty 1

## 2023-02-24 DIAGNOSIS — M5136 Other intervertebral disc degeneration, lumbar region: Secondary | ICD-10-CM | POA: Diagnosis not present

## 2023-02-24 DIAGNOSIS — M069 Rheumatoid arthritis, unspecified: Secondary | ICD-10-CM | POA: Diagnosis not present

## 2023-02-24 DIAGNOSIS — I87333 Chronic venous hypertension (idiopathic) with ulcer and inflammation of bilateral lower extremity: Secondary | ICD-10-CM | POA: Diagnosis not present

## 2023-02-24 DIAGNOSIS — K5730 Diverticulosis of large intestine without perforation or abscess without bleeding: Secondary | ICD-10-CM | POA: Diagnosis not present

## 2023-03-01 DIAGNOSIS — K5730 Diverticulosis of large intestine without perforation or abscess without bleeding: Secondary | ICD-10-CM | POA: Diagnosis not present

## 2023-03-01 DIAGNOSIS — M5136 Other intervertebral disc degeneration, lumbar region: Secondary | ICD-10-CM | POA: Diagnosis not present

## 2023-03-01 DIAGNOSIS — I87333 Chronic venous hypertension (idiopathic) with ulcer and inflammation of bilateral lower extremity: Secondary | ICD-10-CM | POA: Diagnosis not present

## 2023-03-01 DIAGNOSIS — M069 Rheumatoid arthritis, unspecified: Secondary | ICD-10-CM | POA: Diagnosis not present

## 2023-03-08 DIAGNOSIS — M069 Rheumatoid arthritis, unspecified: Secondary | ICD-10-CM | POA: Diagnosis not present

## 2023-03-08 DIAGNOSIS — K5730 Diverticulosis of large intestine without perforation or abscess without bleeding: Secondary | ICD-10-CM | POA: Diagnosis not present

## 2023-03-08 DIAGNOSIS — I87333 Chronic venous hypertension (idiopathic) with ulcer and inflammation of bilateral lower extremity: Secondary | ICD-10-CM | POA: Diagnosis not present

## 2023-03-08 DIAGNOSIS — M5136 Other intervertebral disc degeneration, lumbar region: Secondary | ICD-10-CM | POA: Diagnosis not present

## 2023-03-14 DIAGNOSIS — Z1231 Encounter for screening mammogram for malignant neoplasm of breast: Secondary | ICD-10-CM | POA: Diagnosis not present

## 2023-03-15 DIAGNOSIS — I87333 Chronic venous hypertension (idiopathic) with ulcer and inflammation of bilateral lower extremity: Secondary | ICD-10-CM | POA: Diagnosis not present

## 2023-03-15 DIAGNOSIS — M069 Rheumatoid arthritis, unspecified: Secondary | ICD-10-CM | POA: Diagnosis not present

## 2023-03-22 DIAGNOSIS — M069 Rheumatoid arthritis, unspecified: Secondary | ICD-10-CM | POA: Diagnosis not present

## 2023-03-22 DIAGNOSIS — I87333 Chronic venous hypertension (idiopathic) with ulcer and inflammation of bilateral lower extremity: Secondary | ICD-10-CM | POA: Diagnosis not present

## 2023-03-27 DIAGNOSIS — E039 Hypothyroidism, unspecified: Secondary | ICD-10-CM | POA: Diagnosis not present

## 2023-03-27 DIAGNOSIS — M069 Rheumatoid arthritis, unspecified: Secondary | ICD-10-CM | POA: Diagnosis not present

## 2023-03-27 DIAGNOSIS — I87333 Chronic venous hypertension (idiopathic) with ulcer and inflammation of bilateral lower extremity: Secondary | ICD-10-CM | POA: Diagnosis not present

## 2023-04-03 DIAGNOSIS — I87333 Chronic venous hypertension (idiopathic) with ulcer and inflammation of bilateral lower extremity: Secondary | ICD-10-CM | POA: Diagnosis not present

## 2023-04-03 DIAGNOSIS — M069 Rheumatoid arthritis, unspecified: Secondary | ICD-10-CM | POA: Diagnosis not present

## 2023-04-10 DIAGNOSIS — M069 Rheumatoid arthritis, unspecified: Secondary | ICD-10-CM | POA: Diagnosis not present

## 2023-04-10 DIAGNOSIS — E039 Hypothyroidism, unspecified: Secondary | ICD-10-CM | POA: Diagnosis not present

## 2023-04-10 DIAGNOSIS — I87333 Chronic venous hypertension (idiopathic) with ulcer and inflammation of bilateral lower extremity: Secondary | ICD-10-CM | POA: Diagnosis not present

## 2023-04-10 DIAGNOSIS — R7989 Other specified abnormal findings of blood chemistry: Secondary | ICD-10-CM | POA: Diagnosis not present

## 2023-04-14 DIAGNOSIS — M5416 Radiculopathy, lumbar region: Secondary | ICD-10-CM | POA: Diagnosis not present

## 2023-05-03 DIAGNOSIS — H04122 Dry eye syndrome of left lacrimal gland: Secondary | ICD-10-CM | POA: Diagnosis not present

## 2023-05-03 DIAGNOSIS — H10412 Chronic giant papillary conjunctivitis, left eye: Secondary | ICD-10-CM | POA: Diagnosis not present

## 2023-05-08 DIAGNOSIS — M5416 Radiculopathy, lumbar region: Secondary | ICD-10-CM | POA: Diagnosis not present

## 2023-05-08 DIAGNOSIS — M48061 Spinal stenosis, lumbar region without neurogenic claudication: Secondary | ICD-10-CM | POA: Diagnosis not present

## 2023-05-08 DIAGNOSIS — Z6836 Body mass index (BMI) 36.0-36.9, adult: Secondary | ICD-10-CM | POA: Diagnosis not present

## 2023-05-10 DIAGNOSIS — G2581 Restless legs syndrome: Secondary | ICD-10-CM | POA: Diagnosis not present

## 2023-05-10 DIAGNOSIS — R531 Weakness: Secondary | ICD-10-CM | POA: Diagnosis not present

## 2023-05-10 DIAGNOSIS — G8929 Other chronic pain: Secondary | ICD-10-CM | POA: Diagnosis not present

## 2023-05-10 DIAGNOSIS — M5416 Radiculopathy, lumbar region: Secondary | ICD-10-CM | POA: Diagnosis not present

## 2023-05-10 DIAGNOSIS — Z6836 Body mass index (BMI) 36.0-36.9, adult: Secondary | ICD-10-CM | POA: Diagnosis not present

## 2023-05-10 DIAGNOSIS — E669 Obesity, unspecified: Secondary | ICD-10-CM | POA: Diagnosis not present

## 2023-05-10 DIAGNOSIS — M48061 Spinal stenosis, lumbar region without neurogenic claudication: Secondary | ICD-10-CM | POA: Diagnosis not present

## 2023-05-15 DIAGNOSIS — G2581 Restless legs syndrome: Secondary | ICD-10-CM | POA: Diagnosis not present

## 2023-05-15 DIAGNOSIS — G8929 Other chronic pain: Secondary | ICD-10-CM | POA: Diagnosis not present

## 2023-05-15 DIAGNOSIS — M48061 Spinal stenosis, lumbar region without neurogenic claudication: Secondary | ICD-10-CM | POA: Diagnosis not present

## 2023-05-15 DIAGNOSIS — R531 Weakness: Secondary | ICD-10-CM | POA: Diagnosis not present

## 2023-05-15 DIAGNOSIS — M5416 Radiculopathy, lumbar region: Secondary | ICD-10-CM | POA: Diagnosis not present

## 2023-05-15 DIAGNOSIS — E669 Obesity, unspecified: Secondary | ICD-10-CM | POA: Diagnosis not present

## 2023-05-17 DIAGNOSIS — G2581 Restless legs syndrome: Secondary | ICD-10-CM | POA: Diagnosis not present

## 2023-05-17 DIAGNOSIS — M48061 Spinal stenosis, lumbar region without neurogenic claudication: Secondary | ICD-10-CM | POA: Diagnosis not present

## 2023-05-17 DIAGNOSIS — E669 Obesity, unspecified: Secondary | ICD-10-CM | POA: Diagnosis not present

## 2023-05-17 DIAGNOSIS — G8929 Other chronic pain: Secondary | ICD-10-CM | POA: Diagnosis not present

## 2023-05-17 DIAGNOSIS — R531 Weakness: Secondary | ICD-10-CM | POA: Diagnosis not present

## 2023-05-17 DIAGNOSIS — M5416 Radiculopathy, lumbar region: Secondary | ICD-10-CM | POA: Diagnosis not present

## 2023-05-19 DIAGNOSIS — M069 Rheumatoid arthritis, unspecified: Secondary | ICD-10-CM | POA: Diagnosis not present

## 2023-05-19 DIAGNOSIS — S81811A Laceration without foreign body, right lower leg, initial encounter: Secondary | ICD-10-CM | POA: Diagnosis not present

## 2023-05-19 DIAGNOSIS — I87333 Chronic venous hypertension (idiopathic) with ulcer and inflammation of bilateral lower extremity: Secondary | ICD-10-CM | POA: Diagnosis not present

## 2023-05-24 DIAGNOSIS — G2581 Restless legs syndrome: Secondary | ICD-10-CM | POA: Diagnosis not present

## 2023-05-24 DIAGNOSIS — E669 Obesity, unspecified: Secondary | ICD-10-CM | POA: Diagnosis not present

## 2023-05-24 DIAGNOSIS — G8929 Other chronic pain: Secondary | ICD-10-CM | POA: Diagnosis not present

## 2023-05-24 DIAGNOSIS — M48061 Spinal stenosis, lumbar region without neurogenic claudication: Secondary | ICD-10-CM | POA: Diagnosis not present

## 2023-05-24 DIAGNOSIS — R531 Weakness: Secondary | ICD-10-CM | POA: Diagnosis not present

## 2023-05-24 DIAGNOSIS — M5416 Radiculopathy, lumbar region: Secondary | ICD-10-CM | POA: Diagnosis not present

## 2023-05-26 DIAGNOSIS — G2581 Restless legs syndrome: Secondary | ICD-10-CM | POA: Diagnosis not present

## 2023-05-26 DIAGNOSIS — E669 Obesity, unspecified: Secondary | ICD-10-CM | POA: Diagnosis not present

## 2023-05-26 DIAGNOSIS — M48061 Spinal stenosis, lumbar region without neurogenic claudication: Secondary | ICD-10-CM | POA: Diagnosis not present

## 2023-05-26 DIAGNOSIS — R531 Weakness: Secondary | ICD-10-CM | POA: Diagnosis not present

## 2023-05-26 DIAGNOSIS — M5416 Radiculopathy, lumbar region: Secondary | ICD-10-CM | POA: Diagnosis not present

## 2023-05-26 DIAGNOSIS — G8929 Other chronic pain: Secondary | ICD-10-CM | POA: Diagnosis not present

## 2023-06-02 DIAGNOSIS — R531 Weakness: Secondary | ICD-10-CM | POA: Diagnosis not present

## 2023-06-02 DIAGNOSIS — G2581 Restless legs syndrome: Secondary | ICD-10-CM | POA: Diagnosis not present

## 2023-06-02 DIAGNOSIS — E669 Obesity, unspecified: Secondary | ICD-10-CM | POA: Diagnosis not present

## 2023-06-02 DIAGNOSIS — M48061 Spinal stenosis, lumbar region without neurogenic claudication: Secondary | ICD-10-CM | POA: Diagnosis not present

## 2023-06-02 DIAGNOSIS — G8929 Other chronic pain: Secondary | ICD-10-CM | POA: Diagnosis not present

## 2023-06-02 DIAGNOSIS — M5416 Radiculopathy, lumbar region: Secondary | ICD-10-CM | POA: Diagnosis not present

## 2023-06-07 DIAGNOSIS — G8929 Other chronic pain: Secondary | ICD-10-CM | POA: Diagnosis not present

## 2023-06-07 DIAGNOSIS — I87333 Chronic venous hypertension (idiopathic) with ulcer and inflammation of bilateral lower extremity: Secondary | ICD-10-CM | POA: Diagnosis not present

## 2023-06-07 DIAGNOSIS — E669 Obesity, unspecified: Secondary | ICD-10-CM | POA: Diagnosis not present

## 2023-06-07 DIAGNOSIS — R531 Weakness: Secondary | ICD-10-CM | POA: Diagnosis not present

## 2023-06-07 DIAGNOSIS — G2581 Restless legs syndrome: Secondary | ICD-10-CM | POA: Diagnosis not present

## 2023-06-07 DIAGNOSIS — M5136 Other intervertebral disc degeneration, lumbar region: Secondary | ICD-10-CM | POA: Diagnosis not present

## 2023-06-07 DIAGNOSIS — M48061 Spinal stenosis, lumbar region without neurogenic claudication: Secondary | ICD-10-CM | POA: Diagnosis not present

## 2023-06-07 DIAGNOSIS — M069 Rheumatoid arthritis, unspecified: Secondary | ICD-10-CM | POA: Diagnosis not present

## 2023-06-07 DIAGNOSIS — M5416 Radiculopathy, lumbar region: Secondary | ICD-10-CM | POA: Diagnosis not present

## 2023-06-14 DIAGNOSIS — M069 Rheumatoid arthritis, unspecified: Secondary | ICD-10-CM | POA: Diagnosis not present

## 2023-06-14 DIAGNOSIS — I87333 Chronic venous hypertension (idiopathic) with ulcer and inflammation of bilateral lower extremity: Secondary | ICD-10-CM | POA: Diagnosis not present

## 2023-06-14 DIAGNOSIS — M5136 Other intervertebral disc degeneration, lumbar region: Secondary | ICD-10-CM | POA: Diagnosis not present

## 2023-06-21 DIAGNOSIS — I87333 Chronic venous hypertension (idiopathic) with ulcer and inflammation of bilateral lower extremity: Secondary | ICD-10-CM | POA: Diagnosis not present

## 2023-06-21 DIAGNOSIS — M069 Rheumatoid arthritis, unspecified: Secondary | ICD-10-CM | POA: Diagnosis not present

## 2023-06-21 DIAGNOSIS — M5136 Other intervertebral disc degeneration, lumbar region: Secondary | ICD-10-CM | POA: Diagnosis not present

## 2023-06-27 ENCOUNTER — Telehealth: Payer: Self-pay

## 2023-06-27 ENCOUNTER — Telehealth: Payer: Self-pay | Admitting: "Endocrinology

## 2023-06-27 DIAGNOSIS — E0590 Thyrotoxicosis, unspecified without thyrotoxic crisis or storm: Secondary | ICD-10-CM

## 2023-06-27 NOTE — Telephone Encounter (Signed)
Damarion Mendizabal Ann Sevan Mcbroom, CMA  ?

## 2023-06-27 NOTE — Telephone Encounter (Signed)
patient advising that she tested positive to Covid Monday she jus rescheduled her Lab appointment for Friday. she has an appointment for Monday 07/03/2023. patient asking if she needs to reschedule or not?

## 2023-06-28 ENCOUNTER — Other Ambulatory Visit: Payer: Medicare Other

## 2023-06-30 ENCOUNTER — Other Ambulatory Visit: Payer: Medicare Other

## 2023-06-30 NOTE — Telephone Encounter (Signed)
Patient is still having symptome of Covid an running a high fever. She is going to reschedule her appointments

## 2023-07-03 ENCOUNTER — Ambulatory Visit: Payer: Medicare Other | Admitting: "Endocrinology

## 2023-07-18 DIAGNOSIS — M79672 Pain in left foot: Secondary | ICD-10-CM | POA: Diagnosis not present

## 2023-07-31 ENCOUNTER — Other Ambulatory Visit (HOSPITAL_COMMUNITY): Payer: Self-pay | Admitting: *Deleted

## 2023-08-01 ENCOUNTER — Other Ambulatory Visit (INDEPENDENT_AMBULATORY_CARE_PROVIDER_SITE_OTHER): Payer: Medicare Other

## 2023-08-01 DIAGNOSIS — E0590 Thyrotoxicosis, unspecified without thyrotoxic crisis or storm: Secondary | ICD-10-CM | POA: Diagnosis not present

## 2023-08-01 LAB — T3, FREE: T3, Free: 2.4 pg/mL (ref 2.3–4.2)

## 2023-08-01 LAB — TSH: TSH: 15.09 u[IU]/mL — ABNORMAL HIGH (ref 0.35–5.50)

## 2023-08-01 LAB — T4, FREE: Free T4: 1.29 ng/dL (ref 0.60–1.60)

## 2023-08-02 ENCOUNTER — Other Ambulatory Visit: Payer: Medicare Other

## 2023-08-08 ENCOUNTER — Encounter: Payer: Self-pay | Admitting: "Endocrinology

## 2023-08-08 ENCOUNTER — Ambulatory Visit (INDEPENDENT_AMBULATORY_CARE_PROVIDER_SITE_OTHER): Payer: Medicare Other | Admitting: "Endocrinology

## 2023-08-08 VITALS — BP 126/70 | HR 77 | Ht 65.0 in | Wt 209.6 lb

## 2023-08-08 DIAGNOSIS — E039 Hypothyroidism, unspecified: Secondary | ICD-10-CM

## 2023-08-08 MED ORDER — SYNTHROID 150 MCG PO TABS: 150.0000 ug | ORAL_TABLET | Freq: Every day | ORAL | 1 refills | Status: DC

## 2023-08-08 NOTE — Progress Notes (Signed)
Outpatient Endocrinology Note Altamese Clarence, MD  08/08/23   Jessica Esparza 10-27-1941 [id number]  Referring Provider: Rodrigo Ran, MD Primary Care Provider: Rodrigo Ran, MD Subjective  No chief complaint on file.   Assessment & Plan  Diagnoses and all orders for this visit:  Acquired hypothyroidism -     SYNTHROID 150 MCG tablet; Take 1 tablet (150 mcg total) by mouth daily. -     TSH Rfx on Abnormal to Free T4; Future    ZANAIYAH PODESTA is currently taking synthroid 150 mcg Mon-Sat and 75 mcg on Sun. Patient is currently biochemically hypothyroid.  Educated on thyroid axis.  Recommend the following: Take synthroid every morning.  Advised to take levothyroxine first thing in the morning on empty stomach and wait at least 30 minutes to 1 hour before eating or drinking anything or taking any other medications. Space out levothyroxine by 4 hours from any acid reflux medication/fibrate/iron/calcium/multivitamin. Advised to take birth control pills and nutritional supplements in the evening. Repeat lab before next visit or sooner if symptoms of hyperthyroidism or hypothyroidism develop.  Notify us immediately in case of significant weight gain or loss. Counseled on compliance and follow up needs.  Patient questioned about Cytomel Discussed cytomel at length, discussed risks and benefits   I have reviewed current medications, nurse's notes, allergies, vital signs, past medical and surgical history, family medical history, and social history for this encounter. Counseled patient on symptoms, examination findings, lab findings, imaging results, treatment decisions and monitoring and prognosis. The patient understood the recommendations and agrees with the treatment plan. All questions regarding treatment plan were fully answered.   Return in about 3 months (around 11/08/2023) for visit + labs before next visit.   Altamese [place], MD  08/08/23   I have  reviewed current medications, nurse's notes, allergies, vital signs, past medical and surgical history, family medical history, and social history for this encounter. Counseled patient on symptoms, examination findings, lab findings, imaging results, treatment decisions and monitoring and prognosis. The patient understood the recommendations and agrees with the treatment plan. All questions regarding treatment plan were fully answered.   History of Present Illness Jessica Esparza is a 82 y.o. year old female who presents to our clinic with hypothyroidism diagnosed in 1999.    Symptoms suggestive of HYPOTHYROIDISM:  fatigue Yes weight gain No cold intolerance  Yes constipation  Yes  Compressive symptoms:  dysphagia  No dysphonia  Yes, raspy voice  positional dyspnea (especially with simultaneous arms elevation)  No  Smokes  No On biotin  No Personal history of head/neck surgery/irradiation  No  Reviewed pt's brought labs 04/12/23 TSH 9.98 01/17/23 TSH 19.36   Physical Exam  BP 126/70   Pulse 77   Ht 5\' 5"  (1.651 m)   Wt 209 lb 9.6 oz (95.1 kg)   SpO2 95%   BMI 34.88 kg/m  Constitutional: well developed, well nourished Head: normocephalic, atraumatic, no exophthalmos Eyes: sclera anicteric, no redness Neck: no thyromegaly, no thyroid tenderness; no nodules palpated Lungs: normal respiratory effort Neurology: alert and oriented, no fine hand tremor Skin: dry, no appreciable rashes Musculoskeletal: no appreciable defects Psychiatric: normal mood and affect  Allergies Allergies  Allergen Reactions   Nsaids Shortness Of Breath    Wheezing   Aspirin Other (See Comments)    Other   Cefdinir     Reported on June 2018   Esomeprazole Magnesium Nausea And Vomiting    Most meds for GERD  Penicillins Hives    Current Medications Patient's Medications  New Prescriptions   No medications on file  Previous Medications   ALBUTEROL (PROVENTIL HFA;VENTOLIN HFA) 108 (90  BASE) MCG/ACT INHALER    Inhale 2 puffs into the lungs every 6 (six) hours as needed.    ALLOPURINOL (ZYLOPRIM) 100 MG TABLET    Take 100 mg by mouth daily.   ALPRAZOLAM (XANAX) 0.5 MG TABLET    alprazolam 0.5 mg tablet   AZELAIC ACID 15 % CREAM    Apply 1 application topically 2 (two) times daily.   BUDESONIDE-FORMOTEROL (SYMBICORT) 160-4.5 MCG/ACT INHALER    Inhale 2 puffs into the lungs 2 (two) times daily.   CETIRIZINE (ZYRTEC) 10 MG TABLET    Take 10 mg by mouth.   CLOBETASOL CREAM (TEMOVATE) 0.05 %    Apply topically.   COLCHICINE 0.6 MG TABLET    Take 0.6 mg by mouth daily as needed.    CONJUGATED ESTROGENS (PREMARIN) VAGINAL CREAM    Place vaginally.   ESTROGENS, CONJUGATED, (PREMARIN) 0.45 MG TABLET    Take by mouth.   FAMOTIDINE (PEPCID) 20 MG TABLET    Take 20 mg by mouth.   FLUTICASONE (FLONASE) 50 MCG/ACT NASAL SPRAY       FLUTICASONE PROPIONATE (FLONASE NA)    Place into the nose 2 (two) times daily.   HYDROCODONE-ACETAMINOPHEN (NORCO/VICODIN) 5-325 MG TABLET    Take 1 tablet by mouth every 6 (six) hours as needed for severe pain.   HYDROXYCHLOROQUINE (PLAQUENIL) 200 MG TABLET    Take 200 mg by mouth 2 (two) times daily.   LIDOCAINE (LIDODERM) 5 %    Place 1 patch onto the skin daily. Remove & Discard patch within 12 hours or as directed by MD   LOSARTAN (COZAAR) 50 MG TABLET    losartan 50 mg tablet   MOMETASONE (ASMANEX 60 METERED DOSES) 220 MCG/INH INHALER    Inhale 2 puffs into the lungs daily.   MONTELUKAST (SINGULAIR) 10 MG TABLET    Take 10 mg by mouth at bedtime.   PREDNISONE (DELTASONE) 5 MG TABLET       SIMVASTATIN (ZOCOR) 20 MG TABLET    Take 20 mg by mouth daily.   SUCRALFATE (CARAFATE) 1 G TABLET    Take 1 g by mouth 4 (four) times daily -  with meals and at bedtime.   SYMBICORT 160-4.5 MCG/ACT INHALER       ZOSTER VACCINE ADJUVANTED (SHINGRIX) INJECTION    Shingrix (PF) 50 mcg/0.5 mL intramuscular suspension, kit  Modified Medications   Modified Medication  Previous Medication   SYNTHROID 150 MCG TABLET SYNTHROID 150 MCG tablet      Take 1 tablet (150 mcg total) by mouth daily.    Take 150 mcg by mouth daily. 150 mcg daily except on Sunday, takes 1/2 tab  Discontinued Medications   LEVOTHYROXINE SODIUM (SYNTHROID PO)    Take 150 mcg by mouth daily.     Past Medical History Past Medical History:  Diagnosis Date   Asthma    Celiac disease    Chronic gouty arthritis 02/09/2022   Hypertensive disorder 07/30/2020   Osteoporosis    Thyroid disease     Past Surgical History Past Surgical History:  Procedure Laterality Date   ABDOMINAL HYSTERECTOMY     CHOLECYSTECTOMY     SINUS EXPLORATION      Family History family history is not on file.  Social History Social History   Socioeconomic History   Marital  status: Married    Spouse name: Not on file   Number of children: Not on file   Years of education: Not on file   Highest education level: Not on file  Occupational History   Not on file  Tobacco Use   Smoking status: Former    Current packs/day: 0.00    Average packs/day: 1 pack/day for 20.0 years (20.0 ttl pk-yrs)    Types: Cigarettes    Start date: 12/27/1959    Quit date: 12/27/1979    Years since quitting: 43.6   Smokeless tobacco: Never  Substance and Sexual Activity   Alcohol use: Not on file   Drug use: Not on file   Sexual activity: Not on file  Other Topics Concern   Not on file  Social History Narrative   Not on file   Social Determinants of Health   Financial Resource Strain: Not on file  Food Insecurity: Not on file  Transportation Needs: Not on file  Physical Activity: Not on file  Stress: Not on file  Social Connections: Not on file  Intimate Partner Violence: Not on file    Laboratory Investigations Lab Results  Component Value Date   TSH 15.09 (H) 08/01/2023   FREET4 1.29 08/01/2023     Lab Results  Component Value Date   TSI <89 08/01/2023     No components found for: "TRAB"   No results  found for: "CHOL" No results found for: "HDL" No results found for: "LDLCALC" No results found for: "TRIG" No results found for: "CHOLHDL" Lab Results  Component Value Date   CREATININE 1.23 (H) 04/16/2010   No results found for: "GFR"    Component Value Date/Time   NA 141 04/16/2010 1133   K 3.7 04/16/2010 1133   CL 107 04/16/2010 1133   CO2 27 04/16/2010 1133   GLUCOSE 114 (H) 04/16/2010 1133   BUN 18 04/16/2010 1133   CREATININE 1.23 (H) 04/16/2010 1133   CALCIUM 8.9 04/16/2010 1133   PROT 7.4 04/16/2010 1133   ALBUMIN 4.1 04/16/2010 1133   AST 28 04/16/2010 1133   ALT 23 04/16/2010 1133   ALKPHOS 75 04/16/2010 1133   BILITOT 0.7 04/16/2010 1133   GFRNONAA 44 (L) 04/16/2010 1133   GFRAA (L) 04/16/2010 1133    53        The eGFR has been calculated using the MDRD equation. This calculation has not been validated in all clinical situations. eGFR's persistently <60 mL/min signify possible Chronic Kidney Disease.      Latest Ref Rng & Units 04/16/2010   11:33 AM  BMP  Glucose 70 - 99 mg/dL 213   BUN 6 - 23 mg/dL 18   Creatinine 0.4 - 1.2 mg/dL 0.86   Sodium 578 - 469 mEq/L 141   Potassium 3.5 - 5.1 mEq/L 3.7   Chloride 96 - 112 mEq/L 107   CO2 19 - 32 mEq/L 27   Calcium 8.4 - 10.5 mg/dL 8.9        Component Value Date/Time   WBC 9.7 10/04/2016 1552   WBC 14.4 (H) 04/16/2010 1133   RBC 5.14 10/04/2016 1552   RBC 5.28 (H) 04/16/2010 1133   HGB 14.9 10/04/2016 1552   HGB 14.9 04/16/2010 1133   HCT 43.4 10/04/2016 1552   HCT 46.4 (H) 04/16/2010 1133   PLT 192 04/16/2010 1133   MCV 84.4 10/04/2016 1552   MCH 28.9 10/04/2016 1552   MCHC 34.3 10/04/2016 1552   MCHC 32.1 04/16/2010 1133  RDW 15.3 04/16/2010 1133   LYMPHSABS 1.4 04/16/2010 1133   MONOABS 0.9 04/16/2010 1133   EOSABS 0.0 04/16/2010 1133   BASOSABS 0.1 04/16/2010 1133      Parts of this note may have been dictated using voice recognition software. There may be variances in spelling and  vocabulary which are unintentional. Not all errors are proofread. Please notify the Thereasa Parkin if any discrepancies are noted or if the meaning of any statement is not clear.

## 2023-08-11 ENCOUNTER — Ambulatory Visit (HOSPITAL_COMMUNITY)
Admission: RE | Admit: 2023-08-11 | Discharge: 2023-08-11 | Disposition: A | Discharge status: Home / Self Care | Payer: Medicare Other | Source: Ambulatory Visit | Attending: Internal Medicine | Admitting: Internal Medicine

## 2023-08-11 DIAGNOSIS — M810 Age-related osteoporosis without current pathological fracture: Secondary | ICD-10-CM | POA: Diagnosis not present

## 2023-08-11 MED ORDER — DENOSUMAB 60 MG/ML ~~LOC~~ SOSY
60.0000 mg | PREFILLED_SYRINGE | Freq: Once | SUBCUTANEOUS | Status: AC
  Administered 2023-08-11: 60 mg via SUBCUTANEOUS

## 2023-08-11 MED ORDER — DENOSUMAB 60 MG/ML ~~LOC~~ SOSY
PREFILLED_SYRINGE | SUBCUTANEOUS | Status: AC
  Filled 2023-08-11: qty 1

## 2023-09-08 DIAGNOSIS — Z23 Encounter for immunization: Secondary | ICD-10-CM | POA: Diagnosis not present

## 2023-09-28 DIAGNOSIS — Z79899 Other long term (current) drug therapy: Secondary | ICD-10-CM | POA: Diagnosis not present

## 2023-09-28 DIAGNOSIS — Z961 Presence of intraocular lens: Secondary | ICD-10-CM | POA: Diagnosis not present

## 2023-10-12 DIAGNOSIS — E663 Overweight: Secondary | ICD-10-CM | POA: Diagnosis not present

## 2023-10-12 DIAGNOSIS — I708 Atherosclerosis of other arteries: Secondary | ICD-10-CM | POA: Diagnosis not present

## 2023-10-12 DIAGNOSIS — I87333 Chronic venous hypertension (idiopathic) with ulcer and inflammation of bilateral lower extremity: Secondary | ICD-10-CM | POA: Diagnosis not present

## 2023-10-12 DIAGNOSIS — E785 Hyperlipidemia, unspecified: Secondary | ICD-10-CM | POA: Diagnosis not present

## 2023-10-12 DIAGNOSIS — N1831 Chronic kidney disease, stage 3a: Secondary | ICD-10-CM | POA: Diagnosis not present

## 2023-10-12 DIAGNOSIS — J309 Allergic rhinitis, unspecified: Secondary | ICD-10-CM | POA: Diagnosis not present

## 2023-10-12 DIAGNOSIS — M069 Rheumatoid arthritis, unspecified: Secondary | ICD-10-CM | POA: Diagnosis not present

## 2023-10-12 DIAGNOSIS — E039 Hypothyroidism, unspecified: Secondary | ICD-10-CM | POA: Diagnosis not present

## 2023-10-12 DIAGNOSIS — I8390 Asymptomatic varicose veins of unspecified lower extremity: Secondary | ICD-10-CM | POA: Diagnosis not present

## 2023-10-12 DIAGNOSIS — M810 Age-related osteoporosis without current pathological fracture: Secondary | ICD-10-CM | POA: Diagnosis not present

## 2023-10-12 DIAGNOSIS — K900 Celiac disease: Secondary | ICD-10-CM | POA: Diagnosis not present

## 2023-10-12 DIAGNOSIS — M1380 Other specified arthritis, unspecified site: Secondary | ICD-10-CM | POA: Diagnosis not present

## 2023-10-17 DIAGNOSIS — Z23 Encounter for immunization: Secondary | ICD-10-CM | POA: Diagnosis not present

## 2023-11-01 ENCOUNTER — Other Ambulatory Visit (INDEPENDENT_AMBULATORY_CARE_PROVIDER_SITE_OTHER): Payer: Medicare Other

## 2023-11-01 DIAGNOSIS — E039 Hypothyroidism, unspecified: Secondary | ICD-10-CM

## 2023-11-02 LAB — TSH RFX ON ABNORMAL TO FREE T4: TSH: 1.46 u[IU]/mL (ref 0.450–4.500)

## 2023-11-08 ENCOUNTER — Ambulatory Visit (INDEPENDENT_AMBULATORY_CARE_PROVIDER_SITE_OTHER): Payer: Medicare Other | Admitting: "Endocrinology

## 2023-11-08 ENCOUNTER — Encounter: Payer: Self-pay | Admitting: "Endocrinology

## 2023-11-08 VITALS — BP 130/70 | HR 67 | Ht 65.0 in | Wt 205.0 lb

## 2023-11-08 DIAGNOSIS — E039 Hypothyroidism, unspecified: Secondary | ICD-10-CM | POA: Diagnosis not present

## 2023-11-08 MED ORDER — SYNTHROID 150 MCG PO TABS: 150.0000 ug | ORAL_TABLET | Freq: Every day | ORAL | 1 refills | Status: DC

## 2023-11-08 NOTE — Progress Notes (Signed)
Outpatient Endocrinology Note Altamese Palmarejo, MD  11/08/23   Jessica Esparza 1941-10-26 [id number]  Referring Provider: Rodrigo Ran, MD Primary Care Provider: Rodrigo Ran, MD Subjective  Chief Complaint  Patient presents with   Hypothyroidism    Assessment & Plan  Jessica Esparza was seen today for hypothyroidism.  Diagnoses and all orders for this visit:  Acquired hypothyroidism -     TSH Rfx on Abnormal to Free T4; Future -     SYNTHROID 150 MCG tablet; Take 1 tablet (150 mcg total) by mouth daily.   Jessica Esparza is currently taking synthroid 150 mcg qd. Patient is currently biochemically hypothyroid.  Educated on thyroid axis.  Recommend the following: Take synthroid 150 mcg every morning.  Advised to take levothyroxine first thing in the morning on empty stomach and wait at least 30 minutes to 1 hour before eating or drinking anything or taking any other medications. Space out levothyroxine by 4 hours from any acid reflux medication/fibrate/iron/calcium/multivitamin. Advised to take birth control pills and nutritional supplements in the evening. Repeat lab before next visit or sooner if symptoms of hyperthyroidism or hypothyroidism develop.  Notify us immediately in case of significant weight gain or loss. Counseled on compliance and follow up needs.   I have reviewed current medications, nurse's notes, allergies, vital signs, past medical and surgical history, family medical history, and social history for this encounter. Counseled patient on symptoms, examination findings, lab findings, imaging results, treatment decisions and monitoring and prognosis. The patient understood the recommendations and agrees with the treatment plan. All questions regarding treatment plan were fully answered.   Return in about 3 months (around 02/08/2024) for visit + labs before next visit.   Altamese Trimble, MD  11/08/23   I have reviewed current medications, nurse's notes,  allergies, vital signs, past medical and surgical history, family medical history, and social history for this encounter. Counseled patient on symptoms, examination findings, lab findings, imaging results, treatment decisions and monitoring and prognosis. The patient understood the recommendations and agrees with the treatment plan. All questions regarding treatment plan were fully answered.   History of Present Illness Jessica Esparza is a 82 y.o. year old female who presents to follow up with hypothyroidism diagnosed in 1999.    Patient reports a lot of stress    Symptoms suggestive of HYPOTHYROIDISM:  fatigue Yes weight gain No cold intolerance  Yes, hands are always cold  constipation  Yes  Compressive symptoms:  dysphagia  No dysphonia  Yes, raspy voice  positional dyspnea (especially with simultaneous arms elevation)  No  Smokes  No On biotin  No Personal history of head/neck surgery/irradiation  No  Reviewed pt's brought labs 04/12/23 TSH 9.98 01/17/23 TSH 19.36   Physical Exam  BP 130/70   Pulse 67   Ht 5\' 5"  (1.651 m)   Wt 205 lb (93 kg)   SpO2 94%   BMI 34.11 kg/m  Constitutional: well developed, well nourished Head: normocephalic, atraumatic, no exophthalmos Eyes: sclera anicteric, no redness Neck: no thyromegaly, no thyroid tenderness; no nodules palpated Lungs: normal respiratory effort Neurology: alert and oriented, no fine hand tremor Skin: dry, no appreciable rashes Musculoskeletal: no appreciable defects Psychiatric: normal mood and affect  Allergies Allergies  Allergen Reactions   Nsaids Shortness Of Breath    Wheezing   Aspirin Other (See Comments)    Other   Cefdinir     Reported on June 2018   Esomeprazole Magnesium Nausea And Vomiting  Most meds for GERD   Penicillins Hives    Current Medications Patient's Medications  New Prescriptions   No medications on file  Previous Medications   ALBUTEROL (PROVENTIL HFA;VENTOLIN HFA) 108  (90 BASE) MCG/ACT INHALER    Inhale 2 puffs into the lungs every 6 (six) hours as needed.    ALLOPURINOL (ZYLOPRIM) 100 MG TABLET    Take 100 mg by mouth daily.   ALPRAZOLAM (XANAX) 0.5 MG TABLET    alprazolam 0.5 mg tablet   AZELAIC ACID 15 % CREAM    Apply 1 application topically 2 (two) times daily.   BUDESONIDE-FORMOTEROL (SYMBICORT) 160-4.5 MCG/ACT INHALER    Inhale 2 puffs into the lungs 2 (two) times daily.   CETIRIZINE (ZYRTEC) 10 MG TABLET    Take 10 mg by mouth.   CLOBETASOL CREAM (TEMOVATE) 0.05 %    Apply topically.   COLCHICINE 0.6 MG TABLET    Take 0.6 mg by mouth daily as needed.    CONJUGATED ESTROGENS (PREMARIN) VAGINAL CREAM    Place vaginally.   ESTROGENS, CONJUGATED, (PREMARIN) 0.45 MG TABLET    Take by mouth.   FAMOTIDINE (PEPCID) 20 MG TABLET    Take 20 mg by mouth.   FLUTICASONE (FLONASE) 50 MCG/ACT NASAL SPRAY       FLUTICASONE PROPIONATE (FLONASE NA)    Place into the nose 2 (two) times daily.   HYDROCODONE-ACETAMINOPHEN (NORCO/VICODIN) 5-325 MG TABLET    Take 1 tablet by mouth every 6 (six) hours as needed for severe pain.   HYDROXYCHLOROQUINE (PLAQUENIL) 200 MG TABLET    Take 200 mg by mouth 2 (two) times daily.   LIDOCAINE (LIDODERM) 5 %    Place 1 patch onto the skin daily. Remove & Discard patch within 12 hours or as directed by MD   LOSARTAN (COZAAR) 50 MG TABLET    losartan 50 mg tablet   MOMETASONE (ASMANEX 60 METERED DOSES) 220 MCG/INH INHALER    Inhale 2 puffs into the lungs daily.   MONTELUKAST (SINGULAIR) 10 MG TABLET    Take 10 mg by mouth at bedtime.   PREDNISONE (DELTASONE) 5 MG TABLET       SIMVASTATIN (ZOCOR) 20 MG TABLET    Take 20 mg by mouth daily.   SUCRALFATE (CARAFATE) 1 G TABLET    Take 1 g by mouth 4 (four) times daily -  with meals and at bedtime.   SYMBICORT 160-4.5 MCG/ACT INHALER       ZOSTER VACCINE ADJUVANTED (SHINGRIX) INJECTION    Shingrix (PF) 50 mcg/0.5 mL intramuscular suspension, kit  Modified Medications   Modified Medication  Previous Medication   SYNTHROID 150 MCG TABLET SYNTHROID 150 MCG tablet      Take 1 tablet (150 mcg total) by mouth daily.    Take 1 tablet (150 mcg total) by mouth daily.  Discontinued Medications   No medications on file    Past Medical History Past Medical History:  Diagnosis Date   Asthma    Celiac disease    Chronic gouty arthritis 02/09/2022   Hypertensive disorder 07/30/2020   Osteoporosis    Thyroid disease     Past Surgical History Past Surgical History:  Procedure Laterality Date   ABDOMINAL HYSTERECTOMY     CHOLECYSTECTOMY     SINUS EXPLORATION      Family History family history is not on file.  Social History Social History   Socioeconomic History   Marital status: Married    Spouse name: Not on file  Number of children: Not on file   Years of education: Not on file   Highest education level: Not on file  Occupational History   Not on file  Tobacco Use   Smoking status: Former    Current packs/day: 0.00    Average packs/day: 1 pack/day for 20.0 years (20.0 ttl pk-yrs)    Types: Cigarettes    Start date: 12/27/1959    Quit date: 12/27/1979    Years since quitting: 43.8   Smokeless tobacco: Never  Substance and Sexual Activity   Alcohol use: Not on file   Drug use: Not on file   Sexual activity: Not on file  Other Topics Concern   Not on file  Social History Narrative   Not on file   Social Determinants of Health   Financial Resource Strain: Not on file  Food Insecurity: Not on file  Transportation Needs: Not on file  Physical Activity: Not on file  Stress: Not on file  Social Connections: Not on file  Intimate Partner Violence: Not on file    Laboratory Investigations Lab Results  Component Value Date   TSH 1.460 11/01/2023   TSH 15.09 (H) 08/01/2023   FREET4 1.29 08/01/2023     Lab Results  Component Value Date   TSI <89 08/01/2023     No components found for: "TRAB"   No results found for: "CHOL" No results found for: "HDL" No  results found for: "LDLCALC" No results found for: "TRIG" No results found for: "CHOLHDL" Lab Results  Component Value Date   CREATININE 1.23 (H) 04/16/2010   No results found for: "GFR"    Component Value Date/Time   NA 141 04/16/2010 1133   K 3.7 04/16/2010 1133   CL 107 04/16/2010 1133   CO2 27 04/16/2010 1133   GLUCOSE 114 (H) 04/16/2010 1133   BUN 18 04/16/2010 1133   CREATININE 1.23 (H) 04/16/2010 1133   CALCIUM 8.9 04/16/2010 1133   PROT 7.4 04/16/2010 1133   ALBUMIN 4.1 04/16/2010 1133   AST 28 04/16/2010 1133   ALT 23 04/16/2010 1133   ALKPHOS 75 04/16/2010 1133   BILITOT 0.7 04/16/2010 1133   GFRNONAA 44 (L) 04/16/2010 1133   GFRAA (L) 04/16/2010 1133    53        The eGFR has been calculated using the MDRD equation. This calculation has not been validated in all clinical situations. eGFR's persistently <60 mL/min signify possible Chronic Kidney Disease.      Latest Ref Rng & Units 04/16/2010   11:33 AM  BMP  Glucose 70 - 99 mg/dL 578   BUN 6 - 23 mg/dL 18   Creatinine 0.4 - 1.2 mg/dL 4.69   Sodium 629 - 528 mEq/L 141   Potassium 3.5 - 5.1 mEq/L 3.7   Chloride 96 - 112 mEq/L 107   CO2 19 - 32 mEq/L 27   Calcium 8.4 - 10.5 mg/dL 8.9        Component Value Date/Time   WBC 9.7 10/04/2016 1552   WBC 14.4 (H) 04/16/2010 1133   RBC 5.14 10/04/2016 1552   RBC 5.28 (H) 04/16/2010 1133   HGB 14.9 10/04/2016 1552   HGB 14.9 04/16/2010 1133   HCT 43.4 10/04/2016 1552   HCT 46.4 (H) 04/16/2010 1133   PLT 192 04/16/2010 1133   MCV 84.4 10/04/2016 1552   MCH 28.9 10/04/2016 1552   MCHC 34.3 10/04/2016 1552   MCHC 32.1 04/16/2010 1133   RDW 15.3 04/16/2010 1133  LYMPHSABS 1.4 04/16/2010 1133   MONOABS 0.9 04/16/2010 1133   EOSABS 0.0 04/16/2010 1133   BASOSABS 0.1 04/16/2010 1133      Parts of this note may have been dictated using voice recognition software. There may be variances in spelling and vocabulary which are unintentional. Not all errors  are proofread. Please notify the Thereasa Parkin if any discrepancies are noted or if the meaning of any statement is not clear.

## 2023-11-08 NOTE — Addendum Note (Signed)
Addended by: Altamese [place] on: 11/08/2023 11:39 AM   Modules accepted: Level of Service

## 2023-11-29 DIAGNOSIS — S2241XA Multiple fractures of ribs, right side, initial encounter for closed fracture: Secondary | ICD-10-CM | POA: Diagnosis not present

## 2023-11-29 DIAGNOSIS — M069 Rheumatoid arthritis, unspecified: Secondary | ICD-10-CM | POA: Diagnosis not present

## 2023-11-29 DIAGNOSIS — R0781 Pleurodynia: Secondary | ICD-10-CM | POA: Diagnosis not present

## 2023-11-29 DIAGNOSIS — W19XXXA Unspecified fall, initial encounter: Secondary | ICD-10-CM | POA: Diagnosis not present

## 2024-01-25 ENCOUNTER — Other Ambulatory Visit: Payer: Self-pay

## 2024-01-25 DIAGNOSIS — E039 Hypothyroidism, unspecified: Secondary | ICD-10-CM

## 2024-02-01 ENCOUNTER — Other Ambulatory Visit: Payer: Medicare Other

## 2024-02-01 DIAGNOSIS — E039 Hypothyroidism, unspecified: Secondary | ICD-10-CM | POA: Diagnosis not present

## 2024-02-01 LAB — TSH+FREE T4: TSH W/REFLEX TO FT4: 0.59 m[IU]/L (ref 0.40–4.50)

## 2024-02-08 ENCOUNTER — Ambulatory Visit: Payer: Medicare Other | Admitting: "Endocrinology

## 2024-02-09 ENCOUNTER — Other Ambulatory Visit (HOSPITAL_COMMUNITY): Payer: Self-pay

## 2024-02-12 ENCOUNTER — Ambulatory Visit (HOSPITAL_COMMUNITY)
Admission: RE | Admit: 2024-02-12 | Discharge: 2024-02-12 | Disposition: A | Discharge status: Home / Self Care | Payer: Medicare Other | Source: Ambulatory Visit | Attending: Internal Medicine | Admitting: Internal Medicine

## 2024-02-12 DIAGNOSIS — M810 Age-related osteoporosis without current pathological fracture: Secondary | ICD-10-CM | POA: Diagnosis not present

## 2024-02-12 MED ORDER — DENOSUMAB 60 MG/ML ~~LOC~~ SOSY
PREFILLED_SYRINGE | SUBCUTANEOUS | Status: AC
  Filled 2024-02-12: qty 1

## 2024-02-12 MED ORDER — DENOSUMAB 60 MG/ML ~~LOC~~ SOSY
60.0000 mg | PREFILLED_SYRINGE | Freq: Once | SUBCUTANEOUS | Status: AC
  Administered 2024-02-12: 60 mg via SUBCUTANEOUS

## 2024-03-20 ENCOUNTER — Encounter: Payer: Self-pay | Admitting: "Endocrinology

## 2024-03-20 ENCOUNTER — Ambulatory Visit (INDEPENDENT_AMBULATORY_CARE_PROVIDER_SITE_OTHER): Admitting: "Endocrinology

## 2024-03-20 VITALS — BP 130/78 | HR 65 | Ht 65.0 in | Wt 205.0 lb

## 2024-03-20 DIAGNOSIS — E039 Hypothyroidism, unspecified: Secondary | ICD-10-CM | POA: Diagnosis not present

## 2024-03-20 MED ORDER — LEVOTHYROXINE SODIUM 137 MCG PO TABS
137.0000 ug | ORAL_TABLET | Freq: Every day | ORAL | 1 refills | Status: DC
Start: 2024-03-20 — End: 2024-06-20

## 2024-03-20 NOTE — Progress Notes (Signed)
 Outpatient Endocrinology Note Jessica Sheldon, MD  03/20/24   Jessica Esparza 1941-12-14 [id number]  Referring Provider: Rodrigo Ran, MD Primary Care Provider: Rodrigo Ran, MD Subjective  No chief complaint on file.   Assessment & Plan  Diagnoses and all orders for this visit:  Acquired hypothyroidism -     TSH(Reflex)  Other orders -     levothyroxine (SYNTHROID) 137 MCG tablet; Take 1 tablet (137 mcg total) by mouth daily before breakfast.    Jessica Esparza is currently taking synthroid 150 mcg qd. Patient is currently biochemically hypothyroid.  Educated on thyroid axis.  Recommend the following: Take synthroid 137 mcg every morning.  Advised to take it first thing in the morning on empty stomach and wait at least 30 minutes to 1 hour before eating or drinking anything or taking any other medications. Space out levothyroxine by 4 hours from any acid reflux medication/fibrate/iron/calcium/multivitamin. Advised to take birth control pills and nutritional supplements in the evening. Repeat lab before next visit or sooner if symptoms of hyperthyroidism or hypothyroidism develop.  Notify us immediately in case of significant weight gain or loss. Counseled on compliance and follow up needs.   I have reviewed current medications, nurse's notes, allergies, vital signs, past medical and surgical history, family medical history, and social history for this encounter. Counseled patient on symptoms, examination findings, lab findings, imaging results, treatment decisions and monitoring and prognosis. The patient understood the recommendations and agrees with the treatment plan. All questions regarding treatment plan were fully answered.   Return in about 3 months (around 06/20/2024) for visit + labs before next visit.   Jessica Everman, MD  03/20/24   I have reviewed current medications, nurse's notes, allergies, vital signs, past medical and surgical history, family  medical history, and social history for this encounter. Counseled patient on symptoms, examination findings, lab findings, imaging results, treatment decisions and monitoring and prognosis. The patient understood the recommendations and agrees with the treatment plan. All questions regarding treatment plan were fully answered.   History of Present Illness Jessica Esparza is a 83 y.o. year old female who presents to follow up with hypothyroidism diagnosed in 1999.    Patient reports hair fall, cold hands and previously mentioned a lot of stress    Symptoms suggestive of HYPOTHYROIDISM:  fatigue Yes weight gain No, lost some weight cold intolerance  Yes, hands are always cold  constipation  Yes  Compressive symptoms:  dysphagia  No dysphonia  Yes, raspy voice  positional dyspnea (especially with simultaneous arms elevation)  No  Smokes  No On biotin  No Personal history of head/neck surgery/irradiation  No  Reviewed pt's previously brought labs 04/12/23 TSH 9.98 01/17/23 TSH 19.36   Physical Exam  BP 130/78   Pulse 65   Ht 5\' 5"  (1.651 m)   Wt 205 lb (93 kg)   SpO2 98%   BMI 34.11 kg/m  Constitutional: well developed, well nourished Head: normocephalic, atraumatic, no exophthalmos Eyes: sclera anicteric, no redness Neck: no thyromegaly, no thyroid tenderness; no nodules palpated Lungs: normal respiratory effort Neurology: alert and oriented, no fine hand tremor Skin: dry, no appreciable rashes Musculoskeletal: no appreciable defects Psychiatric: normal mood and affect  Allergies Allergies  Allergen Reactions   Nsaids Shortness Of Breath    Wheezing   Aspirin Other (See Comments)    Other   Cefdinir     Reported on June 2018   Esomeprazole Magnesium Nausea And Vomiting  Most meds for GERD   Penicillins Hives    Current Medications Patient's Medications  New Prescriptions   LEVOTHYROXINE (SYNTHROID) 137 MCG TABLET    Take 1 tablet (137 mcg total) by mouth  daily before breakfast.  Previous Medications   ALBUTEROL (PROVENTIL HFA;VENTOLIN HFA) 108 (90 BASE) MCG/ACT INHALER    Inhale 2 puffs into the lungs every 6 (six) hours as needed.   ALLOPURINOL (ZYLOPRIM) 100 MG TABLET    Take 100 mg by mouth daily.   ALPRAZOLAM (XANAX) 0.5 MG TABLET    alprazolam 0.5 mg tablet   AZELAIC ACID 15 % CREAM    Apply 1 application  topically 2 (two) times daily.   BUDESONIDE-FORMOTEROL (SYMBICORT) 160-4.5 MCG/ACT INHALER    Inhale 2 puffs into the lungs 2 (two) times daily.   CETIRIZINE (ZYRTEC) 10 MG TABLET    Take 10 mg by mouth.   CLOBETASOL CREAM (TEMOVATE) 0.05 %    Apply topically.   COLCHICINE 0.6 MG TABLET    Take 0.6 mg by mouth daily as needed.    CONJUGATED ESTROGENS (PREMARIN) VAGINAL CREAM    Place vaginally.   ESTROGENS, CONJUGATED, (PREMARIN) 0.45 MG TABLET    Take by mouth.   FAMOTIDINE (PEPCID) 20 MG TABLET    Take 20 mg by mouth.   FLUTICASONE (FLONASE) 50 MCG/ACT NASAL SPRAY       FLUTICASONE PROPIONATE (FLONASE NA)    Place into the nose 2 (two) times daily.   HYDROCODONE-ACETAMINOPHEN (NORCO/VICODIN) 5-325 MG TABLET    Take 1 tablet by mouth every 6 (six) hours as needed for severe pain.   HYDROXYCHLOROQUINE (PLAQUENIL) 200 MG TABLET    Take 200 mg by mouth 2 (two) times daily.   LIDOCAINE (LIDODERM) 5 %    Place 1 patch onto the skin daily. Remove & Discard patch within 12 hours or as directed by MD   LOSARTAN (COZAAR) 50 MG TABLET    losartan 50 mg tablet   MOMETASONE (ASMANEX 60 METERED DOSES) 220 MCG/INH INHALER    Inhale 2 puffs into the lungs daily.   MONTELUKAST (SINGULAIR) 10 MG TABLET    Take 10 mg by mouth at bedtime.   PREDNISONE (DELTASONE) 5 MG TABLET       SIMVASTATIN (ZOCOR) 20 MG TABLET    Take 20 mg by mouth daily.   SUCRALFATE (CARAFATE) 1 G TABLET    Take 1 g by mouth 4 (four) times daily -  with meals and at bedtime.   SYMBICORT 160-4.5 MCG/ACT INHALER       ZOSTER VACCINE ADJUVANTED (SHINGRIX) INJECTION    Shingrix (PF)  50 mcg/0.5 mL intramuscular suspension, kit  Modified Medications   No medications on file  Discontinued Medications   SYNTHROID 150 MCG TABLET    Take 1 tablet (150 mcg total) by mouth daily.    Past Medical History Past Medical History:  Diagnosis Date   Asthma    Celiac disease    Chronic gouty arthritis 02/09/2022   Hypertensive disorder 07/30/2020   Osteoporosis    Thyroid disease     Past Surgical History Past Surgical History:  Procedure Laterality Date   ABDOMINAL HYSTERECTOMY     CHOLECYSTECTOMY     SINUS EXPLORATION      Family History family history is not on file.  Social History Social History   Socioeconomic History   Marital status: Married    Spouse name: Not on file   Number of children: Not on file  Years of education: Not on file   Highest education level: Not on file  Occupational History   Not on file  Tobacco Use   Smoking status: Former    Current packs/day: 0.00    Average packs/day: 1 pack/day for 20.0 years (20.0 ttl pk-yrs)    Types: Cigarettes    Start date: 12/27/1959    Quit date: 12/27/1979    Years since quitting: 44.2   Smokeless tobacco: Never  Substance and Sexual Activity   Alcohol use: Not on file   Drug use: Not on file   Sexual activity: Not on file  Other Topics Concern   Not on file  Social History Narrative   Not on file   Social Drivers of Health   Financial Resource Strain: Not on file  Food Insecurity: Not on file  Transportation Needs: Not on file  Physical Activity: Not on file  Stress: Not on file  Social Connections: Not on file  Intimate Partner Violence: Not on file    Laboratory Investigations Lab Results  Component Value Date   TSH 1.460 11/01/2023   TSH 15.09 (H) 08/01/2023   FREET4 1.29 08/01/2023     Lab Results  Component Value Date   TSI <89 08/01/2023     No components found for: "TRAB"   No results found for: "CHOL" No results found for: "HDL" No results found for: "LDLCALC" No  results found for: "TRIG" No results found for: "CHOLHDL" Lab Results  Component Value Date   CREATININE 1.23 (H) 04/16/2010   No results found for: "GFR"    Component Value Date/Time   NA 141 04/16/2010 1133   K 3.7 04/16/2010 1133   CL 107 04/16/2010 1133   CO2 27 04/16/2010 1133   GLUCOSE 114 (H) 04/16/2010 1133   BUN 18 04/16/2010 1133   CREATININE 1.23 (H) 04/16/2010 1133   CALCIUM 8.9 04/16/2010 1133   PROT 7.4 04/16/2010 1133   ALBUMIN 4.1 04/16/2010 1133   AST 28 04/16/2010 1133   ALT 23 04/16/2010 1133   ALKPHOS 75 04/16/2010 1133   BILITOT 0.7 04/16/2010 1133   GFRNONAA 44 (L) 04/16/2010 1133   GFRAA (L) 04/16/2010 1133    53        The eGFR has been calculated using the MDRD equation. This calculation has not been validated in all clinical situations. eGFR's persistently <60 mL/min signify possible Chronic Kidney Disease.      Latest Ref Rng & Units 04/16/2010   11:33 AM  BMP  Glucose 70 - 99 mg/dL 119   BUN 6 - 23 mg/dL 18   Creatinine 0.4 - 1.2 mg/dL 1.47   Sodium 829 - 562 mEq/L 141   Potassium 3.5 - 5.1 mEq/L 3.7   Chloride 96 - 112 mEq/L 107   CO2 19 - 32 mEq/L 27   Calcium 8.4 - 10.5 mg/dL 8.9        Component Value Date/Time   WBC 9.7 10/04/2016 1552   WBC 14.4 (H) 04/16/2010 1133   RBC 5.14 10/04/2016 1552   RBC 5.28 (H) 04/16/2010 1133   HGB 14.9 10/04/2016 1552   HGB 14.9 04/16/2010 1133   HCT 43.4 10/04/2016 1552   HCT 46.4 (H) 04/16/2010 1133   PLT 192 04/16/2010 1133   MCV 84.4 10/04/2016 1552   MCH 28.9 10/04/2016 1552   MCHC 34.3 10/04/2016 1552   MCHC 32.1 04/16/2010 1133   RDW 15.3 04/16/2010 1133   LYMPHSABS 1.4 04/16/2010 1133   MONOABS  0.9 04/16/2010 1133   EOSABS 0.0 04/16/2010 1133   BASOSABS 0.1 04/16/2010 1133      Parts of this note may have been dictated using voice recognition software. There may be variances in spelling and vocabulary which are unintentional. Not all errors are proofread. Please notify the  Thereasa Parkin if any discrepancies are noted or if the meaning of any statement is not clear.

## 2024-03-21 DIAGNOSIS — E039 Hypothyroidism, unspecified: Secondary | ICD-10-CM | POA: Diagnosis not present

## 2024-03-21 DIAGNOSIS — Z1231 Encounter for screening mammogram for malignant neoplasm of breast: Secondary | ICD-10-CM | POA: Diagnosis not present

## 2024-03-21 DIAGNOSIS — M109 Gout, unspecified: Secondary | ICD-10-CM | POA: Diagnosis not present

## 2024-03-21 DIAGNOSIS — E785 Hyperlipidemia, unspecified: Secondary | ICD-10-CM | POA: Diagnosis not present

## 2024-03-21 DIAGNOSIS — I129 Hypertensive chronic kidney disease with stage 1 through stage 4 chronic kidney disease, or unspecified chronic kidney disease: Secondary | ICD-10-CM | POA: Diagnosis not present

## 2024-03-21 DIAGNOSIS — N1831 Chronic kidney disease, stage 3a: Secondary | ICD-10-CM | POA: Diagnosis not present

## 2024-03-21 DIAGNOSIS — M810 Age-related osteoporosis without current pathological fracture: Secondary | ICD-10-CM | POA: Diagnosis not present

## 2024-03-25 DIAGNOSIS — L98491 Non-pressure chronic ulcer of skin of other sites limited to breakdown of skin: Secondary | ICD-10-CM | POA: Diagnosis not present

## 2024-03-25 DIAGNOSIS — R600 Localized edema: Secondary | ICD-10-CM | POA: Diagnosis not present

## 2024-04-01 DIAGNOSIS — M48061 Spinal stenosis, lumbar region without neurogenic claudication: Secondary | ICD-10-CM | POA: Diagnosis not present

## 2024-04-05 DIAGNOSIS — E039 Hypothyroidism, unspecified: Secondary | ICD-10-CM | POA: Diagnosis not present

## 2024-04-05 DIAGNOSIS — I129 Hypertensive chronic kidney disease with stage 1 through stage 4 chronic kidney disease, or unspecified chronic kidney disease: Secondary | ICD-10-CM | POA: Diagnosis not present

## 2024-04-05 DIAGNOSIS — M109 Gout, unspecified: Secondary | ICD-10-CM | POA: Diagnosis not present

## 2024-04-05 DIAGNOSIS — M810 Age-related osteoporosis without current pathological fracture: Secondary | ICD-10-CM | POA: Diagnosis not present

## 2024-04-05 DIAGNOSIS — E785 Hyperlipidemia, unspecified: Secondary | ICD-10-CM | POA: Diagnosis not present

## 2024-04-05 DIAGNOSIS — Z0000 Encounter for general adult medical examination without abnormal findings: Secondary | ICD-10-CM | POA: Diagnosis not present

## 2024-04-05 DIAGNOSIS — M791 Myalgia, unspecified site: Secondary | ICD-10-CM | POA: Diagnosis not present

## 2024-04-05 DIAGNOSIS — K900 Celiac disease: Secondary | ICD-10-CM | POA: Diagnosis not present

## 2024-04-05 DIAGNOSIS — M069 Rheumatoid arthritis, unspecified: Secondary | ICD-10-CM | POA: Diagnosis not present

## 2024-04-05 DIAGNOSIS — J45901 Unspecified asthma with (acute) exacerbation: Secondary | ICD-10-CM | POA: Diagnosis not present

## 2024-04-05 DIAGNOSIS — I708 Atherosclerosis of other arteries: Secondary | ICD-10-CM | POA: Diagnosis not present

## 2024-04-05 DIAGNOSIS — N1831 Chronic kidney disease, stage 3a: Secondary | ICD-10-CM | POA: Diagnosis not present

## 2024-04-18 DIAGNOSIS — M5416 Radiculopathy, lumbar region: Secondary | ICD-10-CM | POA: Diagnosis not present

## 2024-05-30 DIAGNOSIS — M5416 Radiculopathy, lumbar region: Secondary | ICD-10-CM | POA: Diagnosis not present

## 2024-05-30 DIAGNOSIS — M48061 Spinal stenosis, lumbar region without neurogenic claudication: Secondary | ICD-10-CM | POA: Diagnosis not present

## 2024-06-13 DIAGNOSIS — M5416 Radiculopathy, lumbar region: Secondary | ICD-10-CM | POA: Diagnosis not present

## 2024-06-13 DIAGNOSIS — M4726 Other spondylosis with radiculopathy, lumbar region: Secondary | ICD-10-CM | POA: Diagnosis not present

## 2024-06-13 DIAGNOSIS — M48061 Spinal stenosis, lumbar region without neurogenic claudication: Secondary | ICD-10-CM | POA: Diagnosis not present

## 2024-06-13 DIAGNOSIS — M4319 Spondylolisthesis, multiple sites in spine: Secondary | ICD-10-CM | POA: Diagnosis not present

## 2024-06-13 DIAGNOSIS — M545 Low back pain, unspecified: Secondary | ICD-10-CM | POA: Diagnosis not present

## 2024-06-17 ENCOUNTER — Other Ambulatory Visit

## 2024-06-17 DIAGNOSIS — E039 Hypothyroidism, unspecified: Secondary | ICD-10-CM | POA: Diagnosis not present

## 2024-06-18 LAB — TSH(REFL): TSH: 1.71 m[IU]/L (ref 0.40–4.50)

## 2024-06-18 LAB — REFLEX TIQ

## 2024-06-20 ENCOUNTER — Encounter: Payer: Self-pay | Admitting: "Endocrinology

## 2024-06-20 ENCOUNTER — Ambulatory Visit (INDEPENDENT_AMBULATORY_CARE_PROVIDER_SITE_OTHER): Admitting: "Endocrinology

## 2024-06-20 VITALS — BP 120/44 | HR 85 | Ht 65.0 in | Wt 201.0 lb

## 2024-06-20 DIAGNOSIS — R5382 Chronic fatigue, unspecified: Secondary | ICD-10-CM

## 2024-06-20 DIAGNOSIS — E039 Hypothyroidism, unspecified: Secondary | ICD-10-CM

## 2024-06-20 MED ORDER — LIOTHYRONINE SODIUM 5 MCG PO TABS: 5.0000 ug | ORAL_TABLET | Freq: Every day | ORAL | 4 refills | Status: DC

## 2024-06-20 MED ORDER — LEVOTHYROXINE SODIUM 137 MCG PO TABS
137.0000 ug | ORAL_TABLET | Freq: Every day | ORAL | 1 refills | Status: DC
Start: 2024-06-20 — End: 2024-09-24

## 2024-06-20 NOTE — Progress Notes (Signed)
 Outpatient Endocrinology Note Jessica Birmingham, MD  06/20/24   [place] Jessica Esparza Apr 17, 1941 [id number]  Referring Provider: Shayne Anes, MD Primary Care Provider: Shayne Anes, MD Subjective  No chief complaint on file.   Assessment & Plan  Diagnoses and all orders for this visit:  Acquired hypothyroidism -     TSH + free T4 -     T3, free  Chronic fatigue  Other orders -     levothyroxine  (SYNTHROID ) 137 MCG tablet; Take 1 tablet (137 mcg total) by mouth daily before breakfast. -     liothyronine (CYTOMEL) 5 MCG tablet; Take 1 tablet (5 mcg total) by mouth daily.    Jessica Esparza is currently taking synthroid  137 mcg qd. Patient is currently biochemically hypothyroid.  Educated on thyroid  axis.  Recommend the following: Take synthroid  137 mcg every morning.  Advised to take it first thing in the morning on empty stomach and wait at least 30 minutes to 1 hour before eating or drinking anything or taking any other medications. Space out levothyroxine  by 4 hours from any acid reflux medication/fibrate/iron/calcium/multivitamin. Advised to take birth control pills and nutritional supplements in the evening. Repeat lab before next visit or sooner if symptoms of hyperthyroidism or hypothyroidism develop.  Notify us  immediately in case of significant weight gain or loss. Counseled on compliance and follow up needs.  Continued fatigue No cardiac issues per history/records Ordered liothyronine 5 mcg po qam to see if it helps with fatigue, wt will quit is in case of palpitation   I have reviewed current medications, nurse's notes, allergies, vital signs, past medical and surgical history, family medical history, and social history for this encounter. Counseled patient on symptoms, examination findings, lab findings, imaging results, treatment decisions and monitoring and prognosis. The patient understood the recommendations and agrees with the treatment plan. All questions  regarding treatment plan were fully answered.   Return in about 3 months (around 09/20/2024) for visit + labs before next visit.   Jessica Birmingham, MD  06/20/24   I have reviewed current medications, nurse's notes, allergies, vital signs, past medical and surgical history, family medical history, and social history for this encounter. Counseled patient on symptoms, examination findings, lab findings, imaging results, treatment decisions and monitoring and prognosis. The patient understood the recommendations and agrees with the treatment plan. All questions regarding treatment plan were fully answered.   History of Present Illness Jessica Esparza is a 83 y.o. year old female who presents to follow up with hypothyroidism diagnosed in 1999.    Patient reports hair fall, cold hands and previously mentioned a lot of stress    Symptoms suggestive of THYROID :  fatigue Yes weight gain No, lost some weight cold intolerance  Yes, hands are always cold  constipation goes from one extreme to other, but has celiac Palpitations no  Compressive symptoms:  dysphagia  No dysphonia  Yes, raspy voice  positional dyspnea (especially with simultaneous arms elevation)  No  Smokes  No On biotin  No Personal history of head/neck surgery/irradiation  No  Reviewed pt's previously brought labs 04/12/23 TSH 9.98 01/17/23 TSH 19.36   Physical Exam  BP (!) 120/44   Pulse 85   Ht 5' 5 (1.651 m)   Wt 201 lb (91.2 kg)   SpO2 98%   BMI 33.45 kg/m  Constitutional: well developed, well nourished Head: normocephalic, atraumatic, no exophthalmos Eyes: sclera anicteric, no redness Neck: no thyromegaly, no thyroid  tenderness; no nodules palpated Lungs:  normal respiratory effort Neurology: alert and oriented, no fine hand tremor Skin: dry, no appreciable rashes Musculoskeletal: no appreciable defects Psychiatric: normal mood and affect  Allergies Allergies  Allergen Reactions   Nsaids Shortness  Of Breath    Wheezing   Aspirin Other (See Comments)    Other   Cefdinir     Reported on June 2018   Esomeprazole Magnesium Nausea And Vomiting    Most meds for GERD   Penicillins Hives    Current Medications Patient's Medications  New Prescriptions   LIOTHYRONINE (CYTOMEL) 5 MCG TABLET    Take 1 tablet (5 mcg total) by mouth daily.  Previous Medications   ALBUTEROL (PROVENTIL HFA;VENTOLIN HFA) 108 (90 BASE) MCG/ACT INHALER    Inhale 2 puffs into the lungs every 6 (six) hours as needed.   ALLOPURINOL (ZYLOPRIM) 100 MG TABLET    Take 100 mg by mouth daily.   ALPRAZOLAM (XANAX) 0.5 MG TABLET    alprazolam 0.5 mg tablet   AZELAIC ACID 15 % CREAM    Apply 1 application  topically 2 (two) times daily.   BUDESONIDE -FORMOTEROL  (SYMBICORT ) 160-4.5 MCG/ACT INHALER    Inhale 2 puffs into the lungs 2 (two) times daily.   CETIRIZINE (ZYRTEC) 10 MG TABLET    Take 10 mg by mouth.   CLOBETASOL CREAM (TEMOVATE) 0.05 %    Apply topically.   COLCHICINE 0.6 MG TABLET    Take 0.6 mg by mouth daily as needed.    CONJUGATED ESTROGENS (PREMARIN) VAGINAL CREAM    Place vaginally.   ESTROGENS, CONJUGATED, (PREMARIN) 0.45 MG TABLET    Take by mouth.   FAMOTIDINE (PEPCID) 20 MG TABLET    Take 20 mg by mouth.   FLUTICASONE (FLONASE) 50 MCG/ACT NASAL SPRAY       FLUTICASONE PROPIONATE (FLONASE NA)    Place into the nose 2 (two) times daily.   HYDROCODONE -ACETAMINOPHEN  (NORCO/VICODIN) 5-325 MG TABLET    Take 1 tablet by mouth every 6 (six) hours as needed for severe pain.   HYDROXYCHLOROQUINE (PLAQUENIL) 200 MG TABLET    Take 200 mg by mouth 2 (two) times daily.   LIDOCAINE  (LIDODERM ) 5 %    Place 1 patch onto the skin daily. Remove & Discard patch within 12 hours or as directed by MD   LOSARTAN (COZAAR) 50 MG TABLET    losartan 50 mg tablet   MOMETASONE (ASMANEX 60 METERED DOSES) 220 MCG/INH INHALER    Inhale 2 puffs into the lungs daily.   MONTELUKAST (SINGULAIR) 10 MG TABLET    Take 10 mg by mouth at bedtime.    PREDNISONE (DELTASONE) 5 MG TABLET       SIMVASTATIN (ZOCOR) 20 MG TABLET    Take 20 mg by mouth daily.   SUCRALFATE (CARAFATE) 1 G TABLET    Take 1 g by mouth 4 (four) times daily -  with meals and at bedtime.   SYMBICORT  160-4.5 MCG/ACT INHALER       ZOSTER VACCINE ADJUVANTED (SHINGRIX) INJECTION    Shingrix (PF) 50 mcg/0.5 mL intramuscular suspension, kit  Modified Medications   Modified Medication Previous Medication   LEVOTHYROXINE  (SYNTHROID ) 137 MCG TABLET levothyroxine  (SYNTHROID ) 137 MCG tablet      Take 1 tablet (137 mcg total) by mouth daily before breakfast.    Take 1 tablet (137 mcg total) by mouth daily before breakfast.  Discontinued Medications   No medications on file    Past Medical History Past Medical History:  Diagnosis Date  Asthma    Celiac disease    Chronic gouty arthritis 02/09/2022   Hypertensive disorder 07/30/2020   Osteoporosis    Thyroid  disease     Past Surgical History Past Surgical History:  Procedure Laterality Date   ABDOMINAL HYSTERECTOMY     CHOLECYSTECTOMY     SINUS EXPLORATION      Family History family history is not on file.  Social History Social History   Socioeconomic History   Marital status: Married    Spouse name: Not on file   Number of children: Not on file   Years of education: Not on file   Highest education level: Not on file  Occupational History   Not on file  Tobacco Use   Smoking status: Former    Current packs/day: 0.00    Average packs/day: 1 pack/day for 20.0 years (20.0 ttl pk-yrs)    Types: Cigarettes    Start date: 12/27/1959    Quit date: 12/27/1979    Years since quitting: 44.5   Smokeless tobacco: Never  Substance and Sexual Activity   Alcohol use: Not on file   Drug use: Not on file   Sexual activity: Not on file  Other Topics Concern   Not on file  Social History Narrative   Not on file   Social Drivers of Health   Financial Resource Strain: Not on file  Food Insecurity: Not on file   Transportation Needs: Not on file  Physical Activity: Not on file  Stress: Not on file  Social Connections: Not on file  Intimate Partner Violence: Not on file    Laboratory Investigations Lab Results  Component Value Date   TSH 1.460 11/01/2023   TSH 15.09 (H) 08/01/2023   FREET4 1.29 08/01/2023     Lab Results  Component Value Date   TSI <89 08/01/2023     No components found for: TRAB   No results found for: CHOL No results found for: HDL No results found for: LDLCALC No results found for: TRIG No results found for: [facility] Lab Results  Component Value Date   CREATININE 1.23 (H) 04/16/2010   No results found for: GFR    Component Value Date/Time   NA 141 04/16/2010 1133   K 3.7 04/16/2010 1133   CL 107 04/16/2010 1133   CO2 27 04/16/2010 1133   GLUCOSE 114 (H) 04/16/2010 1133   BUN 18 04/16/2010 1133   CREATININE 1.23 (H) 04/16/2010 1133   CALCIUM 8.9 04/16/2010 1133   PROT 7.4 04/16/2010 1133   ALBUMIN 4.1 04/16/2010 1133   AST 28 04/16/2010 1133   ALT 23 04/16/2010 1133   ALKPHOS 75 04/16/2010 1133   BILITOT 0.7 04/16/2010 1133   GFRNONAA 44 (L) 04/16/2010 1133   GFRAA (L) 04/16/2010 1133    53        The eGFR has been calculated using the MDRD equation. This calculation has not been validated in all clinical situations. eGFR's persistently <60 mL/min signify possible Chronic Kidney Disease.      Latest Ref Rng & Units 04/16/2010   11:33 AM  BMP  Glucose 70 - 99 mg/dL 885   BUN 6 - 23 mg/dL 18   Creatinine 0.4 - 1.2 mg/dL 8.76   Sodium 864 - 854 mEq/L 141   Potassium 3.5 - 5.1 mEq/L 3.7   Chloride 96 - 112 mEq/L 107   CO2 19 - 32 mEq/L 27   Calcium 8.4 - 10.5 mg/dL 8.9  Component Value Date/Time   WBC 9.7 10/04/2016 1552   WBC 14.4 (H) 04/16/2010 1133   RBC 5.14 10/04/2016 1552   RBC 5.28 (H) 04/16/2010 1133   HGB 14.9 10/04/2016 1552   HGB 14.9 04/16/2010 1133   HCT 43.4 10/04/2016 1552   HCT 46.4 (H) 04/16/2010  1133   PLT 192 04/16/2010 1133   MCV 84.4 10/04/2016 1552   MCH 28.9 10/04/2016 1552   MCHC 34.3 10/04/2016 1552   MCHC 32.1 04/16/2010 1133   RDW 15.3 04/16/2010 1133   LYMPHSABS 1.4 04/16/2010 1133   MONOABS 0.9 04/16/2010 1133   EOSABS 0.0 04/16/2010 1133   BASOSABS 0.1 04/16/2010 1133      Parts of this note may have been dictated using voice recognition software. There may be variances in spelling and vocabulary which are unintentional. Not all errors are proofread. Please notify the dino if any discrepancies are noted or if the meaning of any statement is not clear.

## 2024-06-24 DIAGNOSIS — J45901 Unspecified asthma with (acute) exacerbation: Secondary | ICD-10-CM | POA: Diagnosis not present

## 2024-06-24 DIAGNOSIS — R0602 Shortness of breath: Secondary | ICD-10-CM | POA: Diagnosis not present

## 2024-06-24 DIAGNOSIS — E039 Hypothyroidism, unspecified: Secondary | ICD-10-CM | POA: Diagnosis not present

## 2024-06-24 DIAGNOSIS — M069 Rheumatoid arthritis, unspecified: Secondary | ICD-10-CM | POA: Diagnosis not present

## 2024-07-08 ENCOUNTER — Telehealth (HOSPITAL_COMMUNITY): Payer: Self-pay | Admitting: Pharmacy Technician

## 2024-07-08 NOTE — Telephone Encounter (Signed)
 Auth Submission: NO AUTH NEEDED Site of care: Site of care: [facility] Payer: Medicare A/B, BCBS Supp Medication & CPT/J Code(s) submitted: Prolia  (Denosumab ) [id number] Diagnosis Code: M81.0 Route of submission (phone, fax, portal):   Phone # Fax # Auth type: Buy/Bill HB Units/visits requested: 60MG  q 6 mths, x 2 doses Reference number:  Approval from: 07/08/24 to 01/25/25  Medicare A/B will cover 80%, BCBS Supp will cover remaining 20%. Med will be covered at 100%.     Stellah Donovan, CPhT [facility] [phone]

## 2024-07-25 DIAGNOSIS — M5416 Radiculopathy, lumbar region: Secondary | ICD-10-CM | POA: Diagnosis not present

## 2024-08-09 ENCOUNTER — Other Ambulatory Visit (HOSPITAL_COMMUNITY): Payer: Self-pay | Admitting: *Deleted

## 2024-08-12 ENCOUNTER — Ambulatory Visit (HOSPITAL_COMMUNITY)
Admission: RE | Admit: 2024-08-12 | Discharge: 2024-08-12 | Disposition: A | Discharge status: Home / Self Care | Source: Ambulatory Visit | Attending: Internal Medicine | Admitting: Internal Medicine

## 2024-08-12 DIAGNOSIS — M810 Age-related osteoporosis without current pathological fracture: Secondary | ICD-10-CM | POA: Insufficient documentation

## 2024-08-12 MED ORDER — DENOSUMAB 60 MG/ML ~~LOC~~ SOSY
PREFILLED_SYRINGE | SUBCUTANEOUS | Status: AC
  Filled 2024-08-12: qty 1

## 2024-08-12 MED ORDER — DENOSUMAB 60 MG/ML ~~LOC~~ SOSY
60.0000 mg | PREFILLED_SYRINGE | Freq: Once | SUBCUTANEOUS | Status: AC
  Administered 2024-08-12: 60 mg via SUBCUTANEOUS

## 2024-08-19 DIAGNOSIS — M069 Rheumatoid arthritis, unspecified: Secondary | ICD-10-CM | POA: Diagnosis not present

## 2024-08-19 DIAGNOSIS — L509 Urticaria, unspecified: Secondary | ICD-10-CM | POA: Diagnosis not present

## 2024-08-19 DIAGNOSIS — L97921 Non-pressure chronic ulcer of unspecified part of left lower leg limited to breakdown of skin: Secondary | ICD-10-CM | POA: Diagnosis not present

## 2024-08-19 DIAGNOSIS — I87332 Chronic venous hypertension (idiopathic) with ulcer and inflammation of left lower extremity: Secondary | ICD-10-CM | POA: Diagnosis not present

## 2024-09-09 ENCOUNTER — Other Ambulatory Visit: Payer: Self-pay

## 2024-09-16 DIAGNOSIS — B372 Candidiasis of skin and nail: Secondary | ICD-10-CM | POA: Diagnosis not present

## 2024-09-16 DIAGNOSIS — L304 Erythema intertrigo: Secondary | ICD-10-CM | POA: Diagnosis not present

## 2024-09-16 DIAGNOSIS — L820 Inflamed seborrheic keratosis: Secondary | ICD-10-CM | POA: Diagnosis not present

## 2024-09-16 DIAGNOSIS — Z85828 Personal history of other malignant neoplasm of skin: Secondary | ICD-10-CM | POA: Diagnosis not present

## 2024-09-17 ENCOUNTER — Other Ambulatory Visit

## 2024-09-17 DIAGNOSIS — E039 Hypothyroidism, unspecified: Secondary | ICD-10-CM | POA: Diagnosis not present

## 2024-09-18 DIAGNOSIS — M48061 Spinal stenosis, lumbar region without neurogenic claudication: Secondary | ICD-10-CM | POA: Diagnosis not present

## 2024-09-18 DIAGNOSIS — M5416 Radiculopathy, lumbar region: Secondary | ICD-10-CM | POA: Diagnosis not present

## 2024-09-18 LAB — T4, FREE: Free T4: 1.6 ng/dL (ref 0.8–1.8)

## 2024-09-18 LAB — TSH+FREE T4: TSH W/REFLEX TO FT4: 0.27 m[IU]/L — ABNORMAL LOW (ref 0.40–4.50)

## 2024-09-18 LAB — T3, FREE: T3, Free: 3.6 pg/mL (ref 2.3–4.2)

## 2024-09-19 ENCOUNTER — Other Ambulatory Visit: Payer: Self-pay

## 2024-09-19 MED ORDER — LIOTHYRONINE SODIUM 5 MCG PO TABS: 5.0000 ug | ORAL_TABLET | Freq: Every day | ORAL | 4 refills | Status: DC

## 2024-09-19 NOTE — Telephone Encounter (Signed)
 Requested Prescriptions   Signed Prescriptions Disp Refills   liothyronine  (CYTOMEL ) 5 MCG tablet 30 tablet 4    Sig: Take 1 tablet (5 mcg total) by mouth daily.    Authorizing Provider: DARTHA ERNST    Ordering User: ARLOA JEOFFREY SAILOR

## 2024-09-24 ENCOUNTER — Ambulatory Visit (INDEPENDENT_AMBULATORY_CARE_PROVIDER_SITE_OTHER): Admitting: "Endocrinology

## 2024-09-24 ENCOUNTER — Encounter: Payer: Self-pay | Admitting: "Endocrinology

## 2024-09-24 VITALS — BP 110/80 | HR 74 | Ht 65.0 in | Wt 208.0 lb

## 2024-09-24 DIAGNOSIS — E039 Hypothyroidism, unspecified: Secondary | ICD-10-CM | POA: Diagnosis not present

## 2024-09-24 MED ORDER — LEVOTHYROXINE SODIUM 112 MCG PO TABS: 112.0000 ug | ORAL_TABLET | Freq: Every day | ORAL | 1 refills | Status: DC

## 2024-09-24 NOTE — Progress Notes (Signed)
 Outpatient Endocrinology Note Jessica Birmingham, MD  09/24/24   [place] Jessica Esparza May 10, 1941 [id number]  Referring Provider: Shayne Anes, MD Primary Care Provider: Shayne Anes, MD Subjective  No chief complaint on file.   Assessment & Plan  Diagnoses and all orders for this visit:  Acquired hypothyroidism -     TSH -     T4, free -     T3, free  Other orders -     levothyroxine  (SYNTHROID ) 112 MCG tablet; Take 1 tablet (112 mcg total) by mouth daily before breakfast. Synthroid    [place] Jessica Esparza is currently taking synthroid  137 mcg every day + 5 mcg of cytomel  a day = TSH 0.27 Patient is currently biochemically hypothyroid.  Educated on thyroid  axis.  Recommend the following: Take synthroid  112 mcg every morning + 5 mcg of cytomel  a day.  Advised to take it first thing in the morning on empty stomach and wait at least 30 minutes to 1 hour before eating or drinking anything or taking any other medications. Space out levothyroxine  by 4 hours from any acid reflux medication/fibrate/iron/calcium/multivitamin. Advised to take birth control pills and nutritional supplements in the evening. Repeat lab before next visit or sooner if symptoms of hyperthyroidism or hypothyroidism develop.  Notify us  immediately in case of significant weight gain or loss. Counseled on compliance and follow up needs.  Continued fatigue No cardiac issues per history/records On liothyronine  5 mcg po qam, no worsening of palpitation, improvement in fatigue   I have reviewed current medications, nurse's notes, allergies, vital signs, past medical and surgical history, family medical history, and social history for this encounter. Counseled patient on symptoms, examination findings, lab findings, imaging results, treatment decisions and monitoring and prognosis. The patient understood the recommendations and agrees with the treatment plan. All questions regarding treatment plan were fully  answered.   Return in about 3 months (around 12/24/2024) for visit + labs before next visit.   Jessica [place], MD  09/24/24   I have reviewed current medications, nurse's notes, allergies, vital signs, past medical and surgical history, family medical history, and social history for this encounter. Counseled patient on symptoms, examination findings, lab findings, imaging results, treatment decisions and monitoring and prognosis. The patient understood the recommendations and agrees with the treatment plan. All questions regarding treatment plan were fully answered.   History of Present Illness Jessica Esparza is a 83 y.o. year old female who presents to follow up with hypothyroidism diagnosed in 1999.    Patient reports hair fall, cold hands and previously mentioned a lot of stress    Symptoms suggestive of THYROID :  fatigue Yes weight gain No, lost some weight heat/cold intolerance  Yes, feet are always hot, hands are cold  constipation goes from one extreme to other, but has celiac Palpitations no  Compressive symptoms:  dysphagia  No dysphonia  Yes, raspy voice  positional dyspnea (especially with simultaneous arms elevation)  No  Smokes  No On biotin  No Personal history of head/neck surgery/irradiation  No  Reviewed pt's previously brought labs 04/12/23 TSH 9.98 01/17/23 TSH 19.36   Physical Exam  BP 110/80   Pulse 74   Ht 5' 5 (1.651 m)   Wt 208 lb (94.3 kg)   SpO2 96%   BMI 34.61 kg/m  Constitutional: well developed, well nourished Head: normocephalic, atraumatic, no exophthalmos Eyes: sclera anicteric, no redness Neck: no thyromegaly, no thyroid  tenderness; no nodules palpated Lungs: normal respiratory effort Neurology: alert and  oriented, no fine hand tremor Skin: dry, no appreciable rashes Musculoskeletal: no appreciable defects Psychiatric: normal mood and affect  Allergies Allergies  Allergen Reactions   Nsaids Shortness Of Breath     Wheezing   Aspirin Other (See Comments)    Other   Cefdinir     Reported on June 2018   Esomeprazole Magnesium Nausea And Vomiting    Most meds for GERD   Penicillins Hives    Current Medications Patient's Medications  New Prescriptions   LEVOTHYROXINE  (SYNTHROID ) 112 MCG TABLET    Take 1 tablet (112 mcg total) by mouth daily before breakfast. Synthroid   Previous Medications   ALBUTEROL (PROVENTIL HFA;VENTOLIN HFA) 108 (90 BASE) MCG/ACT INHALER    Inhale 2 puffs into the lungs every 6 (six) hours as needed.   ALLOPURINOL (ZYLOPRIM) 100 MG TABLET    Take 100 mg by mouth daily.   ALPRAZOLAM (XANAX) 0.5 MG TABLET    alprazolam 0.5 mg tablet   AZELAIC ACID 15 % CREAM    Apply 1 application  topically 2 (two) times daily.   BUDESONIDE -FORMOTEROL  (SYMBICORT ) 160-4.5 MCG/ACT INHALER    Inhale 2 puffs into the lungs 2 (two) times daily.   CETIRIZINE (ZYRTEC) 10 MG TABLET    Take 10 mg by mouth.   CLOBETASOL CREAM (TEMOVATE) 0.05 %    Apply topically.   COLCHICINE 0.6 MG TABLET    Take 0.6 mg by mouth daily as needed.    CONJUGATED ESTROGENS (PREMARIN) VAGINAL CREAM    Place vaginally.   ESTROGENS, CONJUGATED, (PREMARIN) 0.45 MG TABLET    Take by mouth.   FAMOTIDINE (PEPCID) 20 MG TABLET    Take 20 mg by mouth.   FLUTICASONE (FLONASE) 50 MCG/ACT NASAL SPRAY       FLUTICASONE PROPIONATE (FLONASE NA)    Place into the nose 2 (two) times daily.   HYDROCODONE -ACETAMINOPHEN  (NORCO/VICODIN) 5-325 MG TABLET    Take 1 tablet by mouth every 6 (six) hours as needed for severe pain.   HYDROXYCHLOROQUINE (PLAQUENIL) 200 MG TABLET    Take 200 mg by mouth 2 (two) times daily.   LIDOCAINE  (LIDODERM ) 5 %    Place 1 patch onto the skin daily. Remove & Discard patch within 12 hours or as directed by MD   LIOTHYRONINE  (CYTOMEL ) 5 MCG TABLET    Take 1 tablet (5 mcg total) by mouth daily.   LOSARTAN (COZAAR) 50 MG TABLET    losartan 50 mg tablet   MOMETASONE (ASMANEX 60 METERED DOSES) 220 MCG/INH INHALER     Inhale 2 puffs into the lungs daily.   MONTELUKAST (SINGULAIR) 10 MG TABLET    Take 10 mg by mouth at bedtime.   PREDNISONE (DELTASONE) 5 MG TABLET       SIMVASTATIN (ZOCOR) 20 MG TABLET    Take 20 mg by mouth daily.   SUCRALFATE (CARAFATE) 1 G TABLET    Take 1 g by mouth 4 (four) times daily -  with meals and at bedtime.   SYMBICORT  160-4.5 MCG/ACT INHALER       ZOSTER VACCINE ADJUVANTED (SHINGRIX) INJECTION    Shingrix (PF) 50 mcg/0.5 mL intramuscular suspension, kit  Modified Medications   No medications on file  Discontinued Medications   LEVOTHYROXINE  (SYNTHROID ) 137 MCG TABLET    Take 1 tablet (137 mcg total) by mouth daily before breakfast.    Past Medical History Past Medical History:  Diagnosis Date   Asthma    Celiac disease    Chronic  gouty arthritis 02/09/2022   Hypertensive disorder 07/30/2020   Osteoporosis    Thyroid  disease     Past Surgical History Past Surgical History:  Procedure Laterality Date   ABDOMINAL HYSTERECTOMY     CHOLECYSTECTOMY     SINUS EXPLORATION      Family History family history is not on file.  Social History Social History   Socioeconomic History   Marital status: Married    Spouse name: Not on file   Number of children: Not on file   Years of education: Not on file   Highest education level: Not on file  Occupational History   Not on file  Tobacco Use   Smoking status: Former    Current packs/day: 0.00    Average packs/day: 1 pack/day for 20.0 years (20.0 ttl pk-yrs)    Types: Cigarettes    Start date: 12/27/1959    Quit date: 12/27/1979    Years since quitting: 44.7   Smokeless tobacco: Never  Substance and Sexual Activity   Alcohol use: Not on file   Drug use: Not on file   Sexual activity: Not on file  Other Topics Concern   Not on file  Social History Narrative   Not on file   Social Drivers of Health   Financial Resource Strain: Not on file  Food Insecurity: Not on file  Transportation Needs: Not on file   Physical Activity: Not on file  Stress: Not on file  Social Connections: Not on file  Intimate Partner Violence: Not on file    Laboratory Investigations Lab Results  Component Value Date   TSH 1.460 11/01/2023   TSH 15.09 (H) 08/01/2023   FREET4 1.6 09/17/2024   FREET4 1.29 08/01/2023     Lab Results  Component Value Date   TSI <89 08/01/2023     No components found for: TRAB   No results found for: CHOL No results found for: HDL No results found for: LDLCALC No results found for: TRIG No results found for: [facility] Lab Results  Component Value Date   CREATININE 1.23 (H) 04/16/2010   No results found for: GFR    Component Value Date/Time   NA 141 04/16/2010 1133   K 3.7 04/16/2010 1133   CL 107 04/16/2010 1133   CO2 27 04/16/2010 1133   GLUCOSE 114 (H) 04/16/2010 1133   BUN 18 04/16/2010 1133   CREATININE 1.23 (H) 04/16/2010 1133   CALCIUM 8.9 04/16/2010 1133   PROT 7.4 04/16/2010 1133   ALBUMIN 4.1 04/16/2010 1133   AST 28 04/16/2010 1133   ALT 23 04/16/2010 1133   ALKPHOS 75 04/16/2010 1133   BILITOT 0.7 04/16/2010 1133   GFRNONAA 44 (L) 04/16/2010 1133   GFRAA (L) 04/16/2010 1133    53        The eGFR has been calculated using the MDRD equation. This calculation has not been validated in all clinical situations. eGFR's persistently <60 mL/min signify possible Chronic Kidney Disease.      Latest Ref Rng & Units 04/16/2010   11:33 AM  BMP  Glucose 70 - 99 mg/dL 885   BUN 6 - 23 mg/dL 18   Creatinine 0.4 - 1.2 mg/dL 8.76   Sodium 864 - 854 mEq/L 141   Potassium 3.5 - 5.1 mEq/L 3.7   Chloride 96 - 112 mEq/L 107   CO2 19 - 32 mEq/L 27   Calcium 8.4 - 10.5 mg/dL 8.9        Component Value Date/Time  WBC 9.7 10/04/2016 1552   WBC 14.4 (H) 04/16/2010 1133   RBC 5.14 10/04/2016 1552   RBC 5.28 (H) 04/16/2010 1133   HGB 14.9 10/04/2016 1552   HGB 14.9 04/16/2010 1133   HCT 43.4 10/04/2016 1552   HCT 46.4 (H) 04/16/2010 1133    PLT 192 04/16/2010 1133   MCV 84.4 10/04/2016 1552   MCH 28.9 10/04/2016 1552   MCHC 34.3 10/04/2016 1552   MCHC 32.1 04/16/2010 1133   RDW 15.3 04/16/2010 1133   LYMPHSABS 1.4 04/16/2010 1133   MONOABS 0.9 04/16/2010 1133   EOSABS 0.0 04/16/2010 1133   BASOSABS 0.1 04/16/2010 1133      Parts of this note may have been dictated using voice recognition software. There may be variances in spelling and vocabulary which are unintentional. Not all errors are proofread. Please notify the dino if any discrepancies are noted or if the meaning of any statement is not clear.

## 2024-09-25 DIAGNOSIS — L509 Urticaria, unspecified: Secondary | ICD-10-CM | POA: Diagnosis not present

## 2024-09-25 DIAGNOSIS — I129 Hypertensive chronic kidney disease with stage 1 through stage 4 chronic kidney disease, or unspecified chronic kidney disease: Secondary | ICD-10-CM | POA: Diagnosis not present

## 2024-09-25 DIAGNOSIS — M810 Age-related osteoporosis without current pathological fracture: Secondary | ICD-10-CM | POA: Diagnosis not present

## 2024-09-25 DIAGNOSIS — M069 Rheumatoid arthritis, unspecified: Secondary | ICD-10-CM | POA: Diagnosis not present

## 2024-09-25 DIAGNOSIS — D649 Anemia, unspecified: Secondary | ICD-10-CM | POA: Diagnosis not present

## 2024-09-25 DIAGNOSIS — J45901 Unspecified asthma with (acute) exacerbation: Secondary | ICD-10-CM | POA: Diagnosis not present

## 2024-09-25 DIAGNOSIS — N1831 Chronic kidney disease, stage 3a: Secondary | ICD-10-CM | POA: Diagnosis not present

## 2024-09-25 DIAGNOSIS — E039 Hypothyroidism, unspecified: Secondary | ICD-10-CM | POA: Diagnosis not present

## 2024-09-25 DIAGNOSIS — E785 Hyperlipidemia, unspecified: Secondary | ICD-10-CM | POA: Diagnosis not present

## 2024-09-25 DIAGNOSIS — M109 Gout, unspecified: Secondary | ICD-10-CM | POA: Diagnosis not present

## 2024-09-25 DIAGNOSIS — I708 Atherosclerosis of other arteries: Secondary | ICD-10-CM | POA: Diagnosis not present

## 2024-09-25 DIAGNOSIS — Z23 Encounter for immunization: Secondary | ICD-10-CM | POA: Diagnosis not present

## 2024-09-25 DIAGNOSIS — I87332 Chronic venous hypertension (idiopathic) with ulcer and inflammation of left lower extremity: Secondary | ICD-10-CM | POA: Diagnosis not present

## 2024-09-30 ENCOUNTER — Other Ambulatory Visit (HOSPITAL_COMMUNITY): Payer: Self-pay | Admitting: Internal Medicine

## 2024-10-22 DIAGNOSIS — N1831 Chronic kidney disease, stage 3a: Secondary | ICD-10-CM | POA: Diagnosis not present

## 2024-10-30 DIAGNOSIS — I10 Essential (primary) hypertension: Secondary | ICD-10-CM | POA: Diagnosis not present

## 2024-10-30 DIAGNOSIS — M069 Rheumatoid arthritis, unspecified: Secondary | ICD-10-CM | POA: Diagnosis not present

## 2024-10-30 DIAGNOSIS — I87332 Chronic venous hypertension (idiopathic) with ulcer and inflammation of left lower extremity: Secondary | ICD-10-CM | POA: Diagnosis not present

## 2024-10-30 DIAGNOSIS — L03116 Cellulitis of left lower limb: Secondary | ICD-10-CM | POA: Diagnosis not present

## 2024-10-30 DIAGNOSIS — L97921 Non-pressure chronic ulcer of unspecified part of left lower leg limited to breakdown of skin: Secondary | ICD-10-CM | POA: Diagnosis not present

## 2024-11-06 DIAGNOSIS — I10 Essential (primary) hypertension: Secondary | ICD-10-CM | POA: Diagnosis not present

## 2024-11-06 DIAGNOSIS — L97921 Non-pressure chronic ulcer of unspecified part of left lower leg limited to breakdown of skin: Secondary | ICD-10-CM | POA: Diagnosis not present

## 2024-11-06 DIAGNOSIS — I87332 Chronic venous hypertension (idiopathic) with ulcer and inflammation of left lower extremity: Secondary | ICD-10-CM | POA: Diagnosis not present

## 2024-11-06 DIAGNOSIS — L03116 Cellulitis of left lower limb: Secondary | ICD-10-CM | POA: Diagnosis not present

## 2024-11-06 DIAGNOSIS — M069 Rheumatoid arthritis, unspecified: Secondary | ICD-10-CM | POA: Diagnosis not present

## 2024-11-13 DIAGNOSIS — Z1389 Encounter for screening for other disorder: Secondary | ICD-10-CM | POA: Diagnosis not present

## 2024-11-25 ENCOUNTER — Ambulatory Visit (HOSPITAL_COMMUNITY)
Admission: RE | Admit: 2024-11-25 | Discharge: 2024-11-25 | Disposition: A | Source: Ambulatory Visit | Attending: Orthopedic Surgery | Admitting: Orthopedic Surgery

## 2024-11-25 ENCOUNTER — Ambulatory Visit: Admitting: Orthopedic Surgery

## 2024-11-25 DIAGNOSIS — I739 Peripheral vascular disease, unspecified: Secondary | ICD-10-CM | POA: Diagnosis present

## 2024-11-25 DIAGNOSIS — L97921 Non-pressure chronic ulcer of unspecified part of left lower leg limited to breakdown of skin: Secondary | ICD-10-CM

## 2024-11-26 ENCOUNTER — Encounter: Payer: Self-pay | Admitting: Orthopedic Surgery

## 2024-11-26 ENCOUNTER — Ambulatory Visit: Payer: Self-pay

## 2024-11-26 NOTE — Progress Notes (Signed)
 Office Visit Note   Patient: Jessica Esparza           Date of Birth: [birth year]           MRN: [id number] Visit Date: 11/25/2024              Requested by: Shayne Anes, MD [address] [place],  [place] [address] PCP: Shayne Anes, MD  Chief Complaint  Patient presents with   Left Leg - Wound Check      HPI: Discussed the use of AI scribe software for clinical note transcription with the patient, who gave verbal consent to proceed.  History of Present Illness MISKI FELDPAUSCH is an 83 year old female who presents with a chronic ulcer on the left leg.  She has a chronic ulcer located on the anterior mid tibia of the left leg, which originated following blunt trauma. The wound is painful and has been treated with serial ulnar compression wraps. Cultures from the wound have been positive for Pseudomonas.  No history of diabetes but has a history of gout.     Assessment & Plan: Visit Diagnoses:  1. Claudication   2. Skin ulcer of left lower leg, limited to breakdown of skin (HCC)     Plan: Assessment and Plan Assessment & Plan Chronic non-pressure ulcer of left anterior mid tibia with exposed fat layer Chronic ulcer on left anterior mid tibia, 7x10 cm. - Ordered ankle brachial indices. - Scheduled reevaluation in two weeks.  Peripheral arterial disease of left lower extremity Peripheral arterial disease in left lower extremity with monophasic dorsalis pedis pulse and biphasic posterior tibial pulse, no palpable dorsalis pedis pulse. - Ordered ankle brachial indices. - Scheduled reevaluation in two weeks.  Review of the ankle-brachial indices shows monophasic flow to the ankle with adequate great toe circulation.   Start daily Vashe dressing changes.  Compression with an Ace wrap with elevation.     Follow-Up Instructions: Return in about 2 weeks (around 12/09/2024).   Ortho Exam  Patient is alert, oriented, no adenopathy, well-dressed, normal affect,  normal respiratory effort. Physical Exam CARDIOVASCULAR: No palpable dorsalis pedis or posterior tibial pulse. Monophasic dorsalis pedis pulse and biphasic posterior tibial pulse on Doppler. SKIN: Flat ischemic wound on anterior mid tibia left, 7x10 cm.      Imaging: VAS US  ABI WITH/WO TBI Result Date: 11/25/2024  LOWER EXTREMITY DOPPLER STUDY Patient Name:  Jessica Esparza  Date of Exam:   11/25/2024 Medical Rec #: [id number]          Accession #:    [id number] Date of Birth: [birth year]          Patient Gender: F Patient Age:   83 years Exam Location:  [address] Procedure:      VAS US  ABI WITH/WO TBI Referring Phys: Paraskevi Funez --------------------------------------------------------------------------------  Indications: Patient presents with wound on the left anterior shin that has been              slow to heal for the past few months High Risk Factors: Hyperlipidemia, past history of smoking.  Performing Technologist: Edsel Mustard RVT  Examination Guidelines: A complete evaluation includes at minimum, Doppler waveform signals and systolic blood pressure reading at the level of bilateral brachial, anterior tibial, and posterior tibial arteries, when vessel segments are accessible. Bilateral testing is considered an integral part of a complete examination. Photoelectric Plethysmograph (PPG) waveforms and toe systolic pressure readings are included as required and additional duplex testing as needed.  Limited examinations for reoccurring indications may be performed as noted.  ABI Findings: +---------+------------------+-----+---------+--------+ Right    Rt Pressure (mmHg)IndexWaveform Comment  +---------+------------------+-----+---------+--------+ Brachial 156                                      +---------+------------------+-----+---------+--------+ PTA      181               1.16 triphasic         +---------+------------------+-----+---------+--------+ DP       169                1.08 triphasic         +---------+------------------+-----+---------+--------+ Great Toe98                0.63 Abnormal          +---------+------------------+-----+---------+--------+ +---------+------------------+-----+---------+-------+ Left     Lt Pressure (mmHg)IndexWaveform Comment +---------+------------------+-----+---------+-------+ Brachial 153                                     +---------+------------------+-----+---------+-------+ PTA      172               1.10 triphasic        +---------+------------------+-----+---------+-------+ DP       163               1.04 triphasic        +---------+------------------+-----+---------+-------+ Great Toe107               0.69 Abnormal         +---------+------------------+-----+---------+-------+  Summary: Right: Resting right ankle-brachial index is within normal range. The right toe-brachial index is abnormal. Right toe pressure is >60 mmHg which suggests adequate perfusion for healing. Left: Resting left ankle-brachial index is within normal range. The left toe-brachial index is abnormal. Left toe pressure is >60 mmHg which suggests adequate perfusion for healing. *See table(s) above for measurements and observations.  Electronically signed by Debby Robertson on 11/25/2024 at 4:28:59 PM.    Final      Labs: Lab Results  Component Value Date   REPTSTATUS 04/17/2010 FINAL 04/16/2010   CULT NO GROWTH 04/16/2010     Lab Results  Component Value Date   ALBUMIN 4.1 04/16/2010    No results found for: MG No results found for: VD25OH  No results found for: PREALBUMIN    Latest Ref Rng & Units 10/04/2016    3:52 PM 04/16/2010   11:33 AM  CBC EXTENDED  WBC 4.6 - 10.2 K/uL 9.7  14.4   RBC 4.04 - 5.48 M/uL 5.14  5.28   Hemoglobin 12.2 - 16.2 g/dL 85.0  85.0   HCT 62.2 - 47.9 % 43.4  46.4   Platelets 150 - 400 K/uL  192   NEUT# 1.7 - 7.7 K/uL  12.0   Lymph# 0.7 - 4.0 K/uL  1.4      There is  no height or weight on file to calculate BMI.  Orders:  Orders Placed This Encounter  Procedures   VAS US  ABI WITH/WO TBI   No orders of the defined types were placed in this encounter.    Procedures: No procedures performed  Clinical Data: No additional findings.  ROS:  All other systems negative, except as noted in the HPI. Review of Systems  Objective:  Vital Signs: There were no vitals taken for this visit.  Specialty Comments:  No specialty comments available.  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 05/19/2021   Pain in finger of right hand 08/18/2020   Paronychia of finger 08/18/2020   Celiac disease 07/30/2020   Hypercholesterolemia 07/30/2020   Hypertensive disorder 07/30/2020   Hypothyroidism 07/30/2020   Osteoporosis 07/30/2020   Rosacea 07/30/2020   Foraminal stenosis of lumbar region 12/26/2019   Greater trochanteric pain syndrome 12/26/2019   Low back pain 12/06/2019   Paresthesia of both hands 10/08/2019   Hand pain 10/08/2019   Asthmatic bronchitis 07/20/2017   Epistaxis 10/10/2016   Past Medical History:  Diagnosis Date   Asthma    Celiac disease    Chronic gouty arthritis 02/09/2022   Hypertensive disorder 07/30/2020   Osteoporosis    Thyroid  disease     History reviewed. No pertinent family history.  Past Surgical History:  Procedure Laterality Date   ABDOMINAL HYSTERECTOMY     CHOLECYSTECTOMY     SINUS EXPLORATION     Social History   Occupational History   Not on file  Tobacco Use   Smoking status: Former    Current  packs/day: 0.00    Average packs/day: 1 pack/day for 20.0 years (20.0 ttl pk-yrs)    Types: Cigarettes    Start date: 12/27/1959    Quit date: 12/27/1979    Years since quitting: 44.9   Smokeless tobacco: Never  Substance and Sexual Activity   Alcohol use: Not on file   Drug use: Not on file   Sexual activity: Not on file

## 2024-11-26 NOTE — Telephone Encounter (Signed)
 I called and lm on vm to advise.Ti call with any questions. Advised will follow up on 12/09/2024 or sooner should she have any questions or thinks appt needs to be moved up.

## 2024-11-26 NOTE — Telephone Encounter (Signed)
-----   Message from Jerona LULLA Sage sent at 11/26/2024  9:04 AM EST ----- Call patient.  Ankle-brachial indices shows good circulation to the ankle.  Have her continue with the Vashe dressing changes daily elevation and compression with the Ace wrap.  Reevaluate in 2  weeks. ----- Message ----- From: Interface, [employer] Sent: 11/25/2024   4:02 PM EST To: Jerona Sage LULLA, MD

## 2024-12-09 ENCOUNTER — Ambulatory Visit: Admitting: Orthopedic Surgery

## 2024-12-09 DIAGNOSIS — L97921 Non-pressure chronic ulcer of unspecified part of left lower leg limited to breakdown of skin: Secondary | ICD-10-CM

## 2024-12-10 ENCOUNTER — Encounter: Payer: Self-pay | Admitting: Orthopedic Surgery

## 2024-12-10 NOTE — Progress Notes (Signed)
 Office Visit Note   Patient: Jessica Esparza           Date of Birth: [birth year]           MRN: [id number] Visit Date: 12/09/2024              Requested by: Shayne Anes, MD [address] [place],  [place] [address] PCP: Shayne Anes, MD  Chief Complaint  Patient presents with   Left Leg - Follow-up      HPI: Discussed the use of AI scribe software for clinical note transcription with the patient, who gave verbal consent to proceed.  History of Present Illness Jessica Esparza is an 83 year old female who presents for follow-up of a healing ulceration on the left lower extremity.  She notes significant improvement in the healing of the ulceration on her left lower extremity. Blood flow studies confirmed adequate circulation.  She is currently using a blue solution on the wound, followed by gauze and wrapping it. The wound remains tender in certain areas, particularly on the back of her leg, which she attributes to swelling.  She wants to start wearing compression socks but finds it too painful to pull them over the wound at this time. She plans to get socks with less compression to ease the process of putting them on. She is currently using a moisturizing lotion around the edges of the wound but not directly on it.     Assessment & Plan: Visit Diagnoses:  1. Skin ulcer of left lower leg, limited to breakdown of skin (HCC)     Plan: Assessment and Plan Assessment & Plan Chronic ulcer of left lower leg Significant improvement in healing. Adequate circulation for healing despite monophasic pulses. Swelling persists, contributing to tenderness. - Continue Vashe dressing changes with blue solution, gauze, and wrapping. - Encouraged exercise and elevation to reduce swelling. - Resume compression stockings when she can safely put them on.  Chronic venous insufficiency Contributing to swelling and tenderness. Current compression stockings too tight, causing discomfort. -  Obtain compression stockings with 8-10 mmHg compression initially, transitioning to 15-20 mmHg when comfortable.      Follow-Up Instructions: Return in about 4 weeks (around 01/06/2025).   Ortho Exam  Patient is alert, oriented, no adenopathy, well-dressed, normal affect, normal respiratory effort. Physical Exam CARDIOVASCULAR: Pulses monophasic. EXTREMITIES: Significant improvement in healing of ulceration on left lower extremity. Scabbed area measures 4x6 cm. Some swelling present.   Ankle-brachial indices obtained December 1 show triphasic flow at the ankle with a left great toe pressure of 107 and a right great toe pressure of 98      Imaging: No results found. No images are attached to the encounter.  Labs: Lab Results  Component Value Date   REPTSTATUS 04/17/2010 FINAL 04/16/2010   CULT NO GROWTH 04/16/2010     Lab Results  Component Value Date   ALBUMIN 4.1 04/16/2010    No results found for: MG No results found for: VD25OH  No results found for: PREALBUMIN    Latest Ref Rng & Units 10/04/2016    3:52 PM 04/16/2010   11:33 AM  CBC EXTENDED  WBC 4.6 - 10.2 K/uL 9.7  14.4   RBC 4.04 - 5.48 M/uL 5.14  5.28   Hemoglobin 12.2 - 16.2 g/dL 85.0  85.0   HCT 62.2 - 47.9 % 43.4  46.4   Platelets 150 - 400 K/uL  192   NEUT# 1.7 - 7.7 K/uL  12.0  Lymph# 0.7 - 4.0 K/uL  1.4      There is no height or weight on file to calculate BMI.  Orders:  No orders of the defined types were placed in this encounter.  No orders of the defined types were placed in this encounter.    Procedures: No procedures performed  Clinical Data: No additional findings.  ROS:  All other systems negative, except as noted in the HPI. Review of Systems  Objective: Vital Signs: There were no vitals taken for this visit.  Specialty Comments:  No specialty comments available.  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 05/19/2021   Pain in finger of right hand 08/18/2020   Paronychia of finger 08/18/2020   Celiac disease 07/30/2020   Hypercholesterolemia 07/30/2020   Hypertensive disorder 07/30/2020   Hypothyroidism 07/30/2020   Osteoporosis 07/30/2020   Rosacea 07/30/2020   Foraminal stenosis of lumbar region 12/26/2019   Greater trochanteric pain syndrome 12/26/2019   Low back pain 12/06/2019   Paresthesia of both hands 10/08/2019   Hand pain 10/08/2019   Asthmatic bronchitis 07/20/2017   Epistaxis 10/10/2016   Past Medical History:  Diagnosis Date   Asthma    Celiac disease    Chronic gouty arthritis 02/09/2022   Hypertensive disorder 07/30/2020   Osteoporosis    Thyroid  disease     History reviewed. No pertinent family history.  Past Surgical History:  Procedure Laterality Date   ABDOMINAL HYSTERECTOMY     CHOLECYSTECTOMY     SINUS EXPLORATION     Social History   Occupational History   Not on file  Tobacco Use   Smoking status: Former    Current packs/day: 0.00    Average packs/day: 1 pack/day for 20.0 years (20.0 ttl pk-yrs)    Types: Cigarettes    Start date: 12/27/1959    Quit date: 12/27/1979    Years since quitting: 44.9   Smokeless tobacco: Never  Substance and Sexual Activity   Alcohol use: Not on file   Drug use: Not on file   Sexual activity: Not on file

## 2024-12-25 ENCOUNTER — Other Ambulatory Visit

## 2024-12-25 LAB — T4, FREE: Free T4: 1.5 ng/dL (ref 0.8–1.8)

## 2024-12-25 LAB — T3, FREE: T3, Free: 3.7 pg/mL (ref 2.3–4.2)

## 2024-12-25 LAB — TSH: TSH: 1.7 m[IU]/L (ref 0.40–4.50)

## 2024-12-31 ENCOUNTER — Ambulatory Visit (INDEPENDENT_AMBULATORY_CARE_PROVIDER_SITE_OTHER): Admitting: "Endocrinology

## 2024-12-31 ENCOUNTER — Encounter: Payer: Self-pay | Admitting: "Endocrinology

## 2024-12-31 VITALS — BP 130/80 | HR 74 | Ht 64.0 in | Wt 204.0 lb

## 2024-12-31 DIAGNOSIS — E039 Hypothyroidism, unspecified: Secondary | ICD-10-CM | POA: Diagnosis not present

## 2024-12-31 DIAGNOSIS — R5382 Chronic fatigue, unspecified: Secondary | ICD-10-CM | POA: Diagnosis not present

## 2024-12-31 MED ORDER — LEVOTHYROXINE SODIUM 112 MCG PO TABS: 112.0000 ug | ORAL_TABLET | Freq: Every day | ORAL | 3 refills | Status: AC

## 2024-12-31 MED ORDER — LIOTHYRONINE SODIUM 5 MCG PO TABS: 5.0000 ug | ORAL_TABLET | Freq: Every day | ORAL | 3 refills | Status: AC

## 2024-12-31 NOTE — Progress Notes (Signed)
 "    Outpatient Endocrinology Note Jessica Birmingham, MD  12/31/2024   [name] [birth year] [id number]  Referring Provider: Shayne Anes, MD Primary Care Provider: Shayne Anes, MD Subjective  No chief complaint on file.   Assessment & Plan  Diagnoses and all orders for this visit:  Acquired hypothyroidism -     TSH + free T4  Chronic fatigue  Other orders -     levothyroxine  (SYNTHROID ) 112 MCG tablet; Take 1 tablet (112 mcg total) by mouth daily before breakfast. Synthroid  -     liothyronine  (CYTOMEL ) 5 MCG tablet; Take 1 tablet (5 mcg total) by mouth daily.   Jessica Esparza is currently taking synthroid  112 mcg every day + 5 mcg of cytomel  a day = TSH 0.27 Patient is currently biochemically hypothyroid.  Educated on thyroid  axis.  Recommend the following: Continue synthroid  112 mcg every morning + 5 mcg of cytomel  a day.  Advised to take it first thing in the morning on empty stomach and wait at least 30 minutes to 1 hour before eating or drinking anything or taking any other medications. Space out levothyroxine  by 4 hours from any acid reflux medication/fibrate/iron/calcium/multivitamin. Advised to take birth control pills and nutritional supplements in the evening. Repeat lab before next visit or sooner if symptoms of hyperthyroidism or hypothyroidism develop.  Notify us  immediately in case of significant weight gain or loss. Counseled on compliance and follow up needs.  Continued fatigue No cardiac issues per history/records On liothyronine  5 mcg po qam, no worsening of palpitation, improvement in fatigue   I have reviewed current medications, nurse's notes, allergies, vital signs, past medical and surgical history, family medical history, and social history for this encounter. Counseled patient on symptoms, examination findings, lab findings, imaging results, treatment decisions and monitoring and prognosis. The patient understood the recommendations and agrees  with the treatment plan. All questions regarding treatment plan were fully answered.   Return in about 6 months (around 06/30/2025) for visit + labs before next visit.   Jessica Birmingham, MD  12/31/2024   I have reviewed current medications, nurse's notes, allergies, vital signs, past medical and surgical history, family medical history, and social history for this encounter. Counseled patient on symptoms, examination findings, lab findings, imaging results, treatment decisions and monitoring and prognosis. The patient understood the recommendations and agrees with the treatment plan. All questions regarding treatment plan were fully answered.   History of Present Illness Jessica Esparza is a 84 y.o. year old female who presents to follow up with hypothyroidism diagnosed in 1999.    Patient reports hair fall, cold hands and previously mentioned a lot of stress    Symptoms suggestive of THYROID :  fatigue Yes weight gain/loss No, +- few lbs heat/cold intolerance  Yes, feet are always hot, hands are cold  Constipation/hyperdefecation goes from one extreme to other, but has celiac Palpitations no  Compressive symptoms:  dysphagia  No dysphonia  Yes, raspy voice  positional dyspnea (especially with simultaneous arms elevation)  No  Smokes  No On biotin  No Personal history of head/neck surgery/irradiation  No  Reviewed pt's previously brought labs 04/12/23 TSH 9.98 01/17/23 TSH 19.36   Physical Exam  BP 130/80   Pulse 74   Ht 5' 4 (1.626 m)   Wt 204 lb (92.5 kg)   SpO2 96%   BMI 35.02 kg/m  Constitutional: well developed, well nourished Head: normocephalic, atraumatic, no exophthalmos Eyes: sclera anicteric, no redness Neck: no thyromegaly, no  thyroid  tenderness; no nodules palpated Lungs: normal respiratory effort Neurology: alert and oriented, no fine hand tremor Skin: dry, no appreciable rashes Musculoskeletal: no appreciable defects Psychiatric: normal mood and  affect  Allergies Allergies  Allergen Reactions   Nsaids Shortness Of Breath    Wheezing   Aspirin Other (See Comments)    Other   Cefdinir     Reported on June 2018   Esomeprazole Magnesium Nausea And Vomiting    Most meds for GERD   Penicillins Hives    Current Medications Patient's Medications  New Prescriptions   No medications on file  Previous Medications   ALBUTEROL (PROVENTIL HFA;VENTOLIN HFA) 108 (90 BASE) MCG/ACT INHALER    Inhale 2 puffs into the lungs every 6 (six) hours as needed.   ALLOPURINOL (ZYLOPRIM) 100 MG TABLET    Take 100 mg by mouth daily.   ALPRAZOLAM (XANAX) 0.5 MG TABLET    alprazolam 0.5 mg tablet   AZELAIC ACID 15 % CREAM    Apply 1 application  topically 2 (two) times daily.   BUDESONIDE -FORMOTEROL  (SYMBICORT ) 160-4.5 MCG/ACT INHALER    Inhale 2 puffs into the lungs 2 (two) times daily.   CETIRIZINE (ZYRTEC) 10 MG TABLET    Take 10 mg by mouth.   CLOBETASOL CREAM (TEMOVATE) 0.05 %    Apply topically.   COLCHICINE 0.6 MG TABLET    Take 0.6 mg by mouth daily as needed.    CONJUGATED ESTROGENS (PREMARIN) VAGINAL CREAM    Place vaginally.   ESTROGENS, CONJUGATED, (PREMARIN) 0.45 MG TABLET    Take by mouth.   FAMOTIDINE (PEPCID) 20 MG TABLET    Take 20 mg by mouth.   FLUTICASONE (FLONASE) 50 MCG/ACT NASAL SPRAY       FLUTICASONE PROPIONATE (FLONASE NA)    Place into the nose 2 (two) times daily.   HYDROCODONE -ACETAMINOPHEN  (NORCO/VICODIN) 5-325 MG TABLET    Take 1 tablet by mouth every 6 (six) hours as needed for severe pain.   HYDROXYCHLOROQUINE (PLAQUENIL) 200 MG TABLET    Take 200 mg by mouth 2 (two) times daily.   LIDOCAINE  (LIDODERM ) 5 %    Place 1 patch onto the skin daily. Remove & Discard patch within 12 hours or as directed by MD   LOSARTAN (COZAAR) 50 MG TABLET    losartan 50 mg tablet   MOMETASONE (ASMANEX 60 METERED DOSES) 220 MCG/INH INHALER    Inhale 2 puffs into the lungs daily.   MONTELUKAST (SINGULAIR) 10 MG TABLET    Take 10 mg by mouth  at bedtime.   PREDNISONE (DELTASONE) 5 MG TABLET       SIMVASTATIN (ZOCOR) 20 MG TABLET    Take 20 mg by mouth daily.   SUCRALFATE (CARAFATE) 1 G TABLET    Take 1 g by mouth 4 (four) times daily -  with meals and at bedtime.   SYMBICORT  160-4.5 MCG/ACT INHALER       ZOSTER VACCINE ADJUVANTED (SHINGRIX) INJECTION    Shingrix (PF) 50 mcg/0.5 mL intramuscular suspension, kit  Modified Medications   Modified Medication Previous Medication   LEVOTHYROXINE  (SYNTHROID ) 112 MCG TABLET levothyroxine  (SYNTHROID ) 112 MCG tablet      Take 1 tablet (112 mcg total) by mouth daily before breakfast. Synthroid     Take 1 tablet (112 mcg total) by mouth daily before breakfast. Synthroid    LIOTHYRONINE  (CYTOMEL ) 5 MCG TABLET liothyronine  (CYTOMEL ) 5 MCG tablet      Take 1 tablet (5 mcg total) by mouth daily.  Take 1 tablet (5 mcg total) by mouth daily.  Discontinued Medications   No medications on file    Past Medical History Past Medical History:  Diagnosis Date   Asthma    Celiac disease    Chronic gouty arthritis 02/09/2022   Hypertensive disorder 07/30/2020   Osteoporosis    Thyroid  disease     Past Surgical History Past Surgical History:  Procedure Laterality Date   ABDOMINAL HYSTERECTOMY     CHOLECYSTECTOMY     SINUS EXPLORATION      Family History family history is not on file.  Social History Social History   Socioeconomic History   Marital status: Married    Spouse name: Not on file   Number of children: Not on file   Years of education: Not on file   Highest education level: Not on file  Occupational History   Not on file  Tobacco Use   Smoking status: Former    Current packs/day: 0.00    Average packs/day: 1 pack/day for 20.0 years (20.0 ttl pk-yrs)    Types: Cigarettes    Start date: 12/27/1959    Quit date: 12/27/1979    Years since quitting: 45.0   Smokeless tobacco: Never  Substance and Sexual Activity   Alcohol use: Not on file   Drug use: Not on file   Sexual  activity: Not on file  Other Topics Concern   Not on file  Social History Narrative   Not on file   Social Drivers of Health   Tobacco Use: Medium Risk (12/31/2024)   Patient History    Smoking Tobacco Use: Former    Smokeless Tobacco Use: Never    Passive Exposure: Not on Actuary Strain: Not on file  Food Insecurity: Not on file  Transportation Needs: Not on file  Physical Activity: Not on file  Stress: Not on file  Social Connections: Not on file  Intimate Partner Violence: Not on file  Depression (PHQ2-9): Not on file  Alcohol Screen: Not on file  Housing: Not on file  Utilities: Not on file  Health Literacy: Not on file    Laboratory Investigations Lab Results  Component Value Date   TSH 1.70 12/25/2024   TSH 1.460 11/01/2023   TSH 15.09 (H) 08/01/2023   FREET4 1.5 12/25/2024   FREET4 1.6 09/17/2024   FREET4 1.29 08/01/2023     Lab Results  Component Value Date   TSI <89 08/01/2023     No components found for: TRAB   No results found for: CHOL No results found for: HDL No results found for: LDLCALC No results found for: TRIG No results found for: [facility] Lab Results  Component Value Date   CREATININE 1.23 (H) 04/16/2010   No results found for: GFR    Component Value Date/Time   NA 141 04/16/2010 1133   K 3.7 04/16/2010 1133   CL 107 04/16/2010 1133   CO2 27 04/16/2010 1133   GLUCOSE 114 (H) 04/16/2010 1133   BUN 18 04/16/2010 1133   CREATININE 1.23 (H) 04/16/2010 1133   CALCIUM 8.9 04/16/2010 1133   PROT 7.4 04/16/2010 1133   ALBUMIN 4.1 04/16/2010 1133   AST 28 04/16/2010 1133   ALT 23 04/16/2010 1133   ALKPHOS 75 04/16/2010 1133   BILITOT 0.7 04/16/2010 1133   GFRNONAA 44 (L) 04/16/2010 1133   GFRAA (L) 04/16/2010 1133    53        The eGFR has been calculated using the  MDRD equation. This calculation has not been validated in all clinical situations. eGFR's persistently <60 mL/min signify possible  Chronic Kidney Disease.      Latest Ref Rng & Units 04/16/2010   11:33 AM  BMP  Glucose 70 - 99 mg/dL 885   BUN 6 - 23 mg/dL 18   Creatinine 0.4 - 1.2 mg/dL 8.76   Sodium 864 - 854 mEq/L 141   Potassium 3.5 - 5.1 mEq/L 3.7   Chloride 96 - 112 mEq/L 107   CO2 19 - 32 mEq/L 27   Calcium 8.4 - 10.5 mg/dL 8.9        Component Value Date/Time   WBC 9.7 10/04/2016 1552   WBC 14.4 (H) 04/16/2010 1133   RBC 5.14 10/04/2016 1552   RBC 5.28 (H) 04/16/2010 1133   HGB 14.9 10/04/2016 1552   HGB 14.9 04/16/2010 1133   HCT 43.4 10/04/2016 1552   HCT 46.4 (H) 04/16/2010 1133   PLT 192 04/16/2010 1133   MCV 84.4 10/04/2016 1552   MCH 28.9 10/04/2016 1552   MCHC 34.3 10/04/2016 1552   MCHC 32.1 04/16/2010 1133   RDW 15.3 04/16/2010 1133   LYMPHSABS 1.4 04/16/2010 1133   MONOABS 0.9 04/16/2010 1133   EOSABS 0.0 04/16/2010 1133   BASOSABS 0.1 04/16/2010 1133      Parts of this note may have been dictated using voice recognition software. There may be variances in spelling and vocabulary which are unintentional. Not all errors are proofread. Please notify the dino if any discrepancies are noted or if the meaning of any statement is not clear.    "

## 2025-01-06 ENCOUNTER — Encounter: Payer: Self-pay | Admitting: Orthopedic Surgery

## 2025-01-06 ENCOUNTER — Ambulatory Visit: Admitting: Orthopedic Surgery

## 2025-01-06 DIAGNOSIS — L97921 Non-pressure chronic ulcer of unspecified part of left lower leg limited to breakdown of skin: Secondary | ICD-10-CM

## 2025-01-06 NOTE — Progress Notes (Signed)
 "  Office Visit Note   Patient: Jessica Esparza           Date of Birth: [birth year]           MRN: [id number] Visit Date: 01/06/2025              Requested by: Shayne Anes, MD [address] [place],  [place] [address] PCP: Shayne Anes, MD  Chief Complaint  Patient presents with   Left Leg - Wound Check      HPI: Discussed the use of AI scribe software for clinical note transcription with the patient, who gave verbal consent to proceed.  History of Present Illness Jessica Esparza is an 84 year old female with chronic venous insufficiency who presents for follow-up of a healed traumatic wound on the left leg.  She sustained a traumatic venous wound on the left leg, which has been slow to heal. The area remains very tender, with newly formed, fragile skin and persistent erythema. She describes the wound as severe and difficult to recover from.  She previously managed the wound with dressings and wrapping, and currently uses knee-high compression stockings and skin lotion.  She continues to experience mild venous edema in the affected leg. She denies drainage, cellulitis, or other signs of infection.     Assessment & Plan: Visit Diagnoses:  1. Skin ulcer of left lower leg, limited to breakdown of skin (HCC)     Plan: Assessment and Plan Assessment & Plan Chronic venous insufficiency with healed traumatic left leg wound Chronic venous insufficiency with prior traumatic wound to the left leg, now completely healed. Mild venous edema persists. Skin remains tender with new epithelialization. - Recommended knee-high compression stockings to support venous return and promote continued healing. - Advised regular application of skin emollient to the affected area. - Discontinued wound dressing as epithelialization is complete. - Advised follow-up as needed.      Follow-Up Instructions: Return in about 1 week (around 01/13/2025).   Ortho Exam  Patient is alert, oriented,  no adenopathy, well-dressed, normal affect, normal respiratory effort. Physical Exam EXTREMITIES: The traumatic venous wound on the left leg is completely healed. No cellulitis, drainage, or signs of infection. Mild venous swelling on the left leg.      Imaging: No results found. No images are attached to the encounter.  Labs: Lab Results  Component Value Date   REPTSTATUS 04/17/2010 FINAL 04/16/2010   CULT NO GROWTH 04/16/2010     Lab Results  Component Value Date   ALBUMIN 4.1 04/16/2010    No results found for: MG No results found for: VD25OH  No results found for: PREALBUMIN    Latest Ref Rng & Units 10/04/2016    3:52 PM 04/16/2010   11:33 AM  CBC EXTENDED  WBC 4.6 - 10.2 K/uL 9.7  14.4   RBC 4.04 - 5.48 M/uL 5.14  5.28   Hemoglobin 12.2 - 16.2 g/dL 85.0  85.0   HCT 62.2 - 47.9 % 43.4  46.4   Platelets 150 - 400 K/uL  192   NEUT# 1.7 - 7.7 K/uL  12.0   Lymph# 0.7 - 4.0 K/uL  1.4      There is no height or weight on file to calculate BMI.  Orders:  No orders of the defined types were placed in this encounter.  No orders of the defined types were placed in this encounter.    Procedures: No procedures performed  Clinical Data: No additional findings.  ROS:  All other systems negative, except as noted in the HPI. Review of Systems  Objective: Vital Signs: There were no vitals taken for this visit.  Specialty Comments:  No specialty comments available.  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 05/19/2021   Pain in finger of right  hand 08/18/2020   Paronychia of finger 08/18/2020   Celiac disease 07/30/2020   Hypercholesterolemia 07/30/2020   Hypertensive disorder 07/30/2020   Hypothyroidism 07/30/2020   Osteoporosis 07/30/2020   Rosacea 07/30/2020   Foraminal stenosis of lumbar region 12/26/2019   Greater trochanteric pain syndrome 12/26/2019   Low back pain 12/06/2019   Paresthesia of both hands 10/08/2019   Hand pain 10/08/2019   Asthmatic bronchitis 07/20/2017   Epistaxis 10/10/2016   Past Medical History:  Diagnosis Date   Asthma    Celiac disease    Chronic gouty arthritis 02/09/2022   Hypertensive disorder 07/30/2020   Osteoporosis    Thyroid  disease     History reviewed. No pertinent family history.  Past Surgical History:  Procedure Laterality Date   ABDOMINAL HYSTERECTOMY     CHOLECYSTECTOMY     SINUS EXPLORATION     Social History   Occupational History   Not on file  Tobacco Use   Smoking status: Former    Current packs/day: 0.00    Average packs/day: 1 pack/day for 20.0 years (20.0 ttl pk-yrs)    Types: Cigarettes    Start date: 12/27/1959    Quit date: 12/27/1979    Years since quitting: 45.0   Smokeless tobacco: Never  Substance and Sexual Activity   Alcohol use: Not on file   Drug use: Not on file   Sexual activity: Not on file         "

## 2025-01-13 ENCOUNTER — Telehealth (HOSPITAL_COMMUNITY): Payer: Self-pay | Admitting: Pharmacy Technician

## 2025-01-13 NOTE — Telephone Encounter (Signed)
 Auth Submission: NO AUTH NEEDED Site of care: [facility] Payer: Medicare A/B, BCBS Supp Medication & CPT/J Code(s) submitted: Prolia  (Denosumab ) [id number] Diagnosis Code: M81.0 Route of submission (phone, fax, portal):   Phone # Fax # Auth type: Buy/Bill HB Units/visits requested: 60MG  q 6 mths, x 2 doses Reference number:  Approval from: 12/26/24 to 12/25/25  Dagoberto Armour, CPhT [facility] Phone: [phone] 01/13/2025

## 2025-02-13 ENCOUNTER — Inpatient Hospital Stay (HOSPITAL_COMMUNITY): Admission: RE | Admit: 2025-02-13 | Source: Ambulatory Visit

## 2025-06-26 ENCOUNTER — Other Ambulatory Visit

## 2025-07-01 ENCOUNTER — Ambulatory Visit: Admitting: "Endocrinology
